# Patient Record
Sex: Female | Born: 1940 | Race: Black or African American | Hispanic: No | Marital: Married | Smoking: Never smoker
Health system: Southern US, Community
[De-identification: ages and names within clinical notes are randomized; demographics above are authoritative.]

## PROBLEM LIST (undated history)

## (undated) DIAGNOSIS — T7840XA Allergy, unspecified, initial encounter: Secondary | ICD-10-CM

## (undated) DIAGNOSIS — I10 Essential (primary) hypertension: Secondary | ICD-10-CM

## (undated) DIAGNOSIS — I4891 Unspecified atrial fibrillation: Secondary | ICD-10-CM

## (undated) DIAGNOSIS — J45909 Unspecified asthma, uncomplicated: Secondary | ICD-10-CM

## (undated) DIAGNOSIS — E119 Type 2 diabetes mellitus without complications: Secondary | ICD-10-CM

## (undated) DIAGNOSIS — I639 Cerebral infarction, unspecified: Secondary | ICD-10-CM

## (undated) DIAGNOSIS — E785 Hyperlipidemia, unspecified: Secondary | ICD-10-CM

## (undated) DIAGNOSIS — M1990 Unspecified osteoarthritis, unspecified site: Secondary | ICD-10-CM

## (undated) HISTORY — DX: Unspecified asthma, uncomplicated: J45.909

## (undated) HISTORY — PX: BREAST LUMPECTOMY: SHX2

## (undated) HISTORY — DX: Cerebral infarction, unspecified: I63.9

## (undated) HISTORY — DX: Allergy, unspecified, initial encounter: T78.40XA

## (undated) HISTORY — DX: Unspecified osteoarthritis, unspecified site: M19.90

## (undated) HISTORY — DX: Hyperlipidemia, unspecified: E78.5

---

## 2014-03-11 LAB — HM DEXA SCAN

## 2014-09-14 ENCOUNTER — Emergency Department: Payer: Self-pay | Admitting: Emergency Medicine

## 2016-10-29 ENCOUNTER — Other Ambulatory Visit: Payer: Self-pay | Admitting: Endocrinology

## 2016-10-29 DIAGNOSIS — Z1231 Encounter for screening mammogram for malignant neoplasm of breast: Secondary | ICD-10-CM

## 2016-11-29 ENCOUNTER — Ambulatory Visit: Payer: Self-pay

## 2019-11-29 ENCOUNTER — Observation Stay: Payer: Medicare HMO

## 2019-11-29 ENCOUNTER — Inpatient Hospital Stay
Admission: EM | Admit: 2019-11-29 | Discharge: 2019-12-03 | DRG: 065 | Disposition: A | Discharge status: Home Health | Payer: Medicare HMO | Attending: Internal Medicine | Admitting: Internal Medicine

## 2019-11-29 ENCOUNTER — Encounter: Payer: Self-pay | Admitting: Emergency Medicine

## 2019-11-29 ENCOUNTER — Emergency Department: Payer: Medicare HMO

## 2019-11-29 ENCOUNTER — Other Ambulatory Visit: Payer: Self-pay

## 2019-11-29 DIAGNOSIS — E785 Hyperlipidemia, unspecified: Secondary | ICD-10-CM | POA: Diagnosis not present

## 2019-11-29 DIAGNOSIS — H5500 Unspecified nystagmus: Secondary | ICD-10-CM | POA: Diagnosis present

## 2019-11-29 DIAGNOSIS — R918 Other nonspecific abnormal finding of lung field: Secondary | ICD-10-CM | POA: Diagnosis present

## 2019-11-29 DIAGNOSIS — I4891 Unspecified atrial fibrillation: Secondary | ICD-10-CM | POA: Diagnosis not present

## 2019-11-29 DIAGNOSIS — E1165 Type 2 diabetes mellitus with hyperglycemia: Secondary | ICD-10-CM

## 2019-11-29 DIAGNOSIS — Z794 Long term (current) use of insulin: Secondary | ICD-10-CM

## 2019-11-29 DIAGNOSIS — R1084 Generalized abdominal pain: Secondary | ICD-10-CM | POA: Diagnosis not present

## 2019-11-29 DIAGNOSIS — R109 Unspecified abdominal pain: Secondary | ICD-10-CM | POA: Diagnosis present

## 2019-11-29 DIAGNOSIS — I639 Cerebral infarction, unspecified: Secondary | ICD-10-CM | POA: Diagnosis not present

## 2019-11-29 DIAGNOSIS — G8194 Hemiplegia, unspecified affecting left nondominant side: Secondary | ICD-10-CM | POA: Diagnosis present

## 2019-11-29 DIAGNOSIS — I493 Ventricular premature depolarization: Secondary | ICD-10-CM | POA: Diagnosis present

## 2019-11-29 DIAGNOSIS — Z833 Family history of diabetes mellitus: Secondary | ICD-10-CM

## 2019-11-29 DIAGNOSIS — R4781 Slurred speech: Secondary | ICD-10-CM | POA: Diagnosis present

## 2019-11-29 DIAGNOSIS — Z8673 Personal history of transient ischemic attack (TIA), and cerebral infarction without residual deficits: Secondary | ICD-10-CM | POA: Diagnosis present

## 2019-11-29 DIAGNOSIS — I10 Essential (primary) hypertension: Secondary | ICD-10-CM | POA: Diagnosis present

## 2019-11-29 DIAGNOSIS — I63343 Cerebral infarction due to thrombosis of bilateral cerebellar arteries: Secondary | ICD-10-CM | POA: Diagnosis not present

## 2019-11-29 DIAGNOSIS — R112 Nausea with vomiting, unspecified: Secondary | ICD-10-CM | POA: Diagnosis present

## 2019-11-29 DIAGNOSIS — Z20822 Contact with and (suspected) exposure to covid-19: Secondary | ICD-10-CM | POA: Diagnosis present

## 2019-11-29 DIAGNOSIS — Z7984 Long term (current) use of oral hypoglycemic drugs: Secondary | ICD-10-CM

## 2019-11-29 DIAGNOSIS — Z8249 Family history of ischemic heart disease and other diseases of the circulatory system: Secondary | ICD-10-CM

## 2019-11-29 DIAGNOSIS — E1129 Type 2 diabetes mellitus with other diabetic kidney complication: Secondary | ICD-10-CM

## 2019-11-29 DIAGNOSIS — E119 Type 2 diabetes mellitus without complications: Secondary | ICD-10-CM

## 2019-11-29 DIAGNOSIS — I63349 Cerebral infarction due to thrombosis of unspecified cerebellar artery: Secondary | ICD-10-CM

## 2019-11-29 DIAGNOSIS — R29700 NIHSS score 0: Secondary | ICD-10-CM | POA: Diagnosis present

## 2019-11-29 HISTORY — DX: Unspecified atrial fibrillation: I48.91

## 2019-11-29 HISTORY — DX: Type 2 diabetes mellitus without complications: E11.9

## 2019-11-29 HISTORY — DX: Essential (primary) hypertension: I10

## 2019-11-29 LAB — CBC
HCT: 36.4 % (ref 36.0–46.0)
Hemoglobin: 11.9 g/dL — ABNORMAL LOW (ref 12.0–15.0)
MCH: 29.7 pg (ref 26.0–34.0)
MCHC: 32.7 g/dL (ref 30.0–36.0)
MCV: 90.8 fL (ref 80.0–100.0)
Platelets: 230 10*3/uL (ref 150–400)
RBC: 4.01 MIL/uL (ref 3.87–5.11)
RDW: 13.2 % (ref 11.5–15.5)
WBC: 9.8 10*3/uL (ref 4.0–10.5)
nRBC: 0 % (ref 0.0–0.2)

## 2019-11-29 LAB — BASIC METABOLIC PANEL
Anion gap: 14 (ref 5–15)
BUN: 16 mg/dL (ref 8–23)
CO2: 23 mmol/L (ref 22–32)
Calcium: 9 mg/dL (ref 8.9–10.3)
Chloride: 97 mmol/L — ABNORMAL LOW (ref 98–111)
Creatinine, Ser: 1.04 mg/dL — ABNORMAL HIGH (ref 0.44–1.00)
GFR calc Af Amer: 60 mL/min — ABNORMAL LOW (ref >60)
GFR calc non Af Amer: 51 mL/min — ABNORMAL LOW (ref >60)
Glucose, Bld: 274 mg/dL — ABNORMAL HIGH (ref 70–99)
Potassium: 3.8 mmol/L (ref 3.5–5.1)
Sodium: 134 mmol/L — ABNORMAL LOW (ref 135–145)

## 2019-11-29 LAB — LIPASE, BLOOD: Lipase: 34 U/L (ref 11–51)

## 2019-11-29 LAB — GLUCOSE, CAPILLARY
Glucose-Capillary: 154 mg/dL — ABNORMAL HIGH (ref 70–99)
Glucose-Capillary: 166 mg/dL — ABNORMAL HIGH (ref 70–99)
Glucose-Capillary: 203 mg/dL — ABNORMAL HIGH (ref 70–99)
Glucose-Capillary: 250 mg/dL — ABNORMAL HIGH (ref 70–99)

## 2019-11-29 LAB — SARS CORONAVIRUS 2 BY RT PCR (HOSPITAL ORDER, PERFORMED IN ~~LOC~~ HOSPITAL LAB): SARS Coronavirus 2: NEGATIVE

## 2019-11-29 MED ORDER — IOHEXOL 300 MG/ML  SOLN
125.0000 mL | Freq: Once | INTRAMUSCULAR | Status: AC | PRN
  Administered 2019-11-29: 125 mL via INTRAVENOUS

## 2019-11-29 MED ORDER — IOHEXOL 9 MG/ML PO SOLN
500.0000 mL | ORAL | Status: DC
  Administered 2019-11-29: 500 mL via ORAL

## 2019-11-29 MED ORDER — ENOXAPARIN SODIUM 40 MG/0.4ML ~~LOC~~ SOLN
40.0000 mg | SUBCUTANEOUS | Status: DC
  Administered 2019-11-29 – 2019-12-02 (×4): 40 mg via SUBCUTANEOUS
  Filled 2019-11-29 (×4): qty 0.4

## 2019-11-29 MED ORDER — INSULIN GLARGINE 100 UNIT/ML ~~LOC~~ SOLN
12.0000 [IU] | Freq: Every day | SUBCUTANEOUS | Status: DC
  Administered 2019-11-29 – 2019-12-03 (×5): 12 [IU] via SUBCUTANEOUS
  Filled 2019-11-29 (×5): qty 0.12

## 2019-11-29 MED ORDER — ASPIRIN EC 81 MG PO TBEC: 81.0000 mg | DELAYED_RELEASE_TABLET | Freq: Every day | ORAL | Status: DC

## 2019-11-29 MED ORDER — ASPIRIN EC 325 MG PO TBEC
325.0000 mg | DELAYED_RELEASE_TABLET | Freq: Every day | ORAL | Status: DC
  Administered 2019-11-30 – 2019-12-02 (×3): 325 mg via ORAL
  Filled 2019-11-29 (×3): qty 1

## 2019-11-29 MED ORDER — SENNOSIDES-DOCUSATE SODIUM 8.6-50 MG PO TABS: 1.0000 | ORAL_TABLET | Freq: Every evening | ORAL | Status: DC | PRN

## 2019-11-29 MED ORDER — ASPIRIN 81 MG PO CHEW
324.0000 mg | CHEWABLE_TABLET | Freq: Once | ORAL | Status: AC
  Administered 2019-11-29: 324 mg via ORAL
  Filled 2019-11-29: qty 4

## 2019-11-29 MED ORDER — ACETAMINOPHEN 325 MG PO TABS: 650.0000 mg | ORAL_TABLET | Freq: Four times a day (QID) | ORAL | Status: DC | PRN

## 2019-11-29 MED ORDER — STROKE: EARLY STAGES OF RECOVERY BOOK
Freq: Once | Status: AC
  Filled 2019-11-29: qty 1

## 2019-11-29 MED ORDER — MORPHINE SULFATE (PF) 2 MG/ML IV SOLN: 0.5000 mg | INTRAVENOUS | Status: DC | PRN

## 2019-11-29 MED ORDER — INSULIN ASPART 100 UNIT/ML ~~LOC~~ SOLN
0.0000 [IU] | SUBCUTANEOUS | Status: DC
  Administered 2019-11-29 (×2): 2 [IU] via SUBCUTANEOUS
  Administered 2019-11-29: 3 [IU] via SUBCUTANEOUS
  Administered 2019-11-30 (×2): 1 [IU] via SUBCUTANEOUS
  Administered 2019-11-30 (×2): 2 [IU] via SUBCUTANEOUS
  Administered 2019-12-01 (×2): 1 [IU] via SUBCUTANEOUS
  Administered 2019-12-01: 2 [IU] via SUBCUTANEOUS
  Administered 2019-12-01: 3 [IU] via SUBCUTANEOUS
  Administered 2019-12-02 (×2): 2 [IU] via SUBCUTANEOUS
  Administered 2019-12-02: 1 [IU] via SUBCUTANEOUS
  Administered 2019-12-02 – 2019-12-03 (×2): 2 [IU] via SUBCUTANEOUS
  Administered 2019-12-03: 1 [IU] via SUBCUTANEOUS
  Filled 2019-11-29 (×17): qty 1

## 2019-11-29 MED ORDER — HYDRALAZINE HCL 20 MG/ML IJ SOLN: 5.0000 mg | INTRAMUSCULAR | Status: DC | PRN

## 2019-11-29 MED ORDER — SIMVASTATIN 20 MG PO TABS
20.0000 mg | ORAL_TABLET | Freq: Every day | ORAL | Status: DC
  Administered 2019-11-29 – 2019-12-02 (×4): 20 mg via ORAL
  Filled 2019-11-29 (×4): qty 1

## 2019-11-29 MED ORDER — DILTIAZEM HCL 30 MG PO TABS
120.0000 mg | ORAL_TABLET | Freq: Every day | ORAL | Status: DC
  Administered 2019-11-29 – 2019-12-02 (×4): 120 mg via ORAL
  Filled 2019-11-29 (×4): qty 4

## 2019-11-29 MED ORDER — ONDANSETRON HCL 4 MG/2ML IJ SOLN: 4.0000 mg | Freq: Three times a day (TID) | INTRAMUSCULAR | Status: DC | PRN

## 2019-11-29 MED ORDER — SOTALOL HCL 80 MG PO TABS
80.0000 mg | ORAL_TABLET | Freq: Every day | ORAL | Status: DC
  Administered 2019-11-29 – 2019-12-02 (×4): 80 mg via ORAL
  Filled 2019-11-29 (×6): qty 1

## 2019-11-29 NOTE — ED Triage Notes (Signed)
Pt in via EMS from home with c/o nausea and vomiting since last pm. Pt was found sitting on floor and was dizzy. Pt did not fall. Hx of diabetes, a-fib, 165/80, FSBS 309, has not taken insulin today. 98% RA.

## 2019-11-29 NOTE — ED Notes (Signed)
Called CT and informed them that pt was done with oral contrast

## 2019-11-29 NOTE — H&P (Signed)
History and Physical    Tanya Aguirre PZW:[id number] DOB: 1940/11/11 DOA: 11/29/2019  Referring MD/NP/PA:   PCP: Alan Mulder, MD   Patient coming from:  The patient is coming from home.  At baseline, pt is independent for most of ADL.        Chief Complaint: Dizziness, nausea, vomiting and abdominal pain  HPI: Tanya Aguirre is a 79 y.o. female with medical history significant of hypertension, hyperlipidemia, diabetes mellitus, atrial fibrillation not on anticoagulants, who presents with dizziness, nausea vomiting and abdominal pain.  Per her daughter-in-law, patient started having dizziness and a difficulty walking at about 10 PM last night.  She also has some mild slurred speech, but no vision loss or hearing loss.  Patient does not have unilateral numbness or weakness in extremities.  Patient has headache.  She also reports nausea, vomiting and abdominal pain.  She has vomited several times with nonbloody nonbilious vomitus.  Her abdominal pain is located in the central abdomen, constant, aching, moderate, nonradiating.  No diarrhea.  No fever or chills.  No symptoms of UTI.  Patient does not have chest pain, shortness breath, cough.  ED Course: pt was found to have WBC 9.8, pending COVID-19 PCR, creatinine 1.04, BUN 16, lipase 34, temperature normal, blood pressure 155/71, heart rate 85, oxygen saturation 98% on room air. Ct-head showed possible right cerebellar infarct. Pt is placed on progressive bed for observation.  Neurology, Dr. Loretha Brasil is consulted.  Review of Systems:   General: no fevers, chills, no body weight gain, has fatigue HEENT: no blurry vision, hearing changes or sore throat Respiratory: no dyspnea, coughing, wheezing CV: no chest pain, no palpitations GI: has nausea, vomiting, abdominal pain, no diarrhea, constipation GU: no dysuria, burning on urination, increased urinary frequency, hematuria  Ext: no leg edema Neuro: no unilateral weakness, numbness,  or tingling, no vision change or hearing loss. Has dizziness and slurred speech Skin: no rash, no skin tear. MSK: No muscle spasm, no deformity, no limitation of range of movement in spin Heme: No easy bruising.  Travel history: No recent long distant travel.  Allergy: No Known Allergies  Past Medical History:  Diagnosis Date  . A-fib (HCC)   . Diabetes mellitus without complication (HCC)   . Hypertension     Past Surgical History:  Procedure Laterality Date  . BREAST LUMPECTOMY Right     Social History:  reports that she has never smoked. She has never used smokeless tobacco. She reports that she does not drink alcohol or use drugs.  Family History:  Family History  Problem Relation Age of Onset  . Hypertension Father   . Hypertension Sister   . Diabetes Mellitus II Brother      Prior to Admission medications   Medication Sig Start Date End Date Taking? Authorizing Provider  amLODipine (NORVASC) 10 MG tablet Take 10 mg by mouth daily at 6 (six) AM. 10/25/19   [provider]  diltiazem (CARDIZEM) 120 MG tablet Take 120 mg by mouth at bedtime. 10/26/19   [provider]  furosemide (LASIX) 40 MG tablet Take 40 mg by mouth 2 (two) times daily. 10/25/19   [provider]  metFORMIN (GLUCOPHAGE) 1000 MG tablet Take 1,000 mg by mouth 2 (two) times daily. 10/25/19   [provider]  potassium chloride SA (KLOR-CON) 20 MEQ tablet Take 20 mEq by mouth daily. 10/25/19   [provider]  simvastatin (ZOCOR) 20 MG tablet Take 20 mg by mouth at bedtime.  10/26/19   [provider]  sotalol (BETAPACE) 80 MG tablet Take 80 mg by mouth at bedtime. 10/25/19   [provider]  Vitamin D, Ergocalciferol, (DRISDOL) 1.25 MG (50000 UNIT) CAPS capsule Take 50,000 Units by mouth once a week. 10/25/19   [provider]    Physical Exam: Vitals:   11/29/19 1200 11/29/19 1230 11/29/19 1300 11/29/19 1330  BP: (!) 152/84 (!) 158/70  136/74 128/63  Pulse:  (!) 103 (!) 103 (!) 101  Resp: 13 12    Temp:      TempSrc:      SpO2:  98% 96% 96%  Weight:      Height:       General: Not in acute distress HEENT:       Eyes: PERRL, EOMI, no scleral icterus.       ENT: No discharge from the ears and nose, no pharynx injection, no tonsillar enlargement.        Neck: No JVD, no bruit, no mass felt. Heme: No neck lymph node enlargement. Cardiac: S1/S2, RRR, No murmurs, No gallops or rubs. Respiratory:  No rales, wheezing, rhonchi or rubs. GI: Soft, nondistended, has tenderness in central abdomen, no rebound pain, no organomegaly, BS present. GU: No hematuria Ext: No pitting leg edema bilaterally. 2+DP/PT pulse bilaterally. Musculoskeletal: No joint deformities, No joint redness or warmth, no limitation of ROM in spin. Skin: No rashes.  Neuro: Alert, oriented X3, cranial nerves II-XII grossly intact.  Muscle strength 5/5 in all extremities, sensation to light touch intact. Brachial reflex 2+ bilaterally. Psych: Patient is not psychotic, no suicidal or hemocidal ideation.  Labs on Admission: I have personally reviewed following labs and imaging studies  CBC: Recent Labs  Lab 11/29/19 0923  WBC 9.8  HGB 11.9*  HCT 36.4  MCV 90.8  PLT 230   Basic Metabolic Panel: Recent Labs  Lab 11/29/19 0923  NA 134*  K 3.8  CL 97*  CO2 23  GLUCOSE 274*  BUN 16  CREATININE 1.04*  CALCIUM 9.0   GFR: Estimated Creatinine Clearance: 55.7 mL/min (A) (by C-G formula based on SCr of 1.04 mg/dL (H)). Liver Function Tests: No results for input(s): AST, ALT, ALKPHOS, BILITOT, PROT, ALBUMIN in the last 168 hours. Recent Labs  Lab 11/29/19 0923  LIPASE 34   No results for input(s): AMMONIA in the last 168 hours. Coagulation Profile: No results for input(s): INR, PROTIME in the last 168 hours. Cardiac Enzymes: No results for input(s): CKTOTAL, CKMB, CKMBINDEX, TROPONINI in the last 168 hours. BNP (last 3 results) No results  for input(s): PROBNP in the last 8760 hours. HbA1C: No results for input(s): HGBA1C in the last 72 hours. CBG: Recent Labs  Lab 11/29/19 0922 11/29/19 1519  GLUCAP 250* 203*   Lipid Profile: No results for input(s): CHOL, HDL, LDLCALC, TRIG, CHOLHDL, LDLDIRECT in the last 72 hours. Thyroid Function Tests: No results for input(s): TSH, T4TOTAL, FREET4, T3FREE, THYROIDAB in the last 72 hours. Anemia Panel: No results for input(s): VITAMINB12, FOLATE, FERRITIN, TIBC, IRON, RETICCTPCT in the last 72 hours. Urine analysis: No results found for: COLORURINE, APPEARANCEUR, LABSPEC, PHURINE, GLUCOSEU, HGBUR, BILIRUBINUR, KETONESUR, PROTEINUR, UROBILINOGEN, NITRITE, LEUKOCYTESUR Sepsis Labs: @LABRCNTIP (procalcitonin:4,lacticidven:4) ) Recent Results (from the past 240 hour(s))  SARS Coronavirus 2 by RT PCR (hospital order, performed in [facility] hospital lab) Nasopharyngeal Nasopharyngeal Swab     Status: None   Collection Time: 11/29/19 12:07 PM   Specimen: Nasopharyngeal Swab  Result Value Ref Range Status  SARS Coronavirus 2 NEGATIVE NEGATIVE Final    Comment: (NOTE) SARS-CoV-2 target nucleic acids are NOT DETECTED. The SARS-CoV-2 RNA is generally detectable in upper and lower respiratory specimens during the acute phase of infection. The lowest concentration of SARS-CoV-2 viral copies this assay can detect is 250 copies / mL. A negative result does not preclude SARS-CoV-2 infection and should not be used as the sole basis for treatment or other patient management decisions.  A negative result may occur with improper specimen collection / handling, submission of specimen other than nasopharyngeal swab, presence of viral mutation(s) within the areas targeted by this assay, and inadequate number of viral copies (<250 copies / mL). A negative result must be combined with clinical observations, patient history, and epidemiological information. Fact Sheet for Patients:    BoilerBrush.com.cyhttps://www.fda.gov/media/136312/download Fact Sheet for Healthcare Providers: https://pope.com/https://www.fda.gov/media/136313/download This test is not yet approved or cleared  by the [place] FDA and has been authorized for detection and/or diagnosis of SARS-CoV-2 by FDA under an Emergency Use Authorization (EUA).  This EUA will remain in effect (meaning this test can be used) for the duration of the COVID-19 declaration under Section 564(b)(1) of the Act, 21 U.S.C. section 360bbb-3(b)(1), unless the authorization is terminated or revoked sooner. Performed at [facility], [address] [place], [place] [address]      Radiological Exams on Admission: CT HEAD WO CONTRAST  Result Date: 11/29/2019 CLINICAL DATA:  Recent fall with facial pain, initial encounter EXAM: CT HEAD WITHOUT CONTRAST TECHNIQUE: Contiguous axial images were obtained from the base of the skull through the vertex without intravenous contrast. COMPARISON:  None. FINDINGS: Brain: There is a geographic area of decreased attenuation identified within the right cerebellar hemisphere consistent with acute to subacute ischemia. Mild atrophic changes are noted commenced with the patient's given age. No findings to suggest acute hemorrhage or space-occupying mass lesion are noted. Vascular: No hyperdense vessel or unexpected calcification. Skull: Normal. Negative for fracture or focal lesion. Sinuses/Orbits: No acute finding. Other: None. IMPRESSION: Geographic area of decreased attenuation within the right cerebellar hemisphere superiorly consistent with acute to subacute ischemia. MRI may be helpful further evaluation. Electronically Signed   By: Alcide CleverMark  Lukens M.D.   On: 11/29/2019 09:34   MR BRAIN WO CONTRAST  Result Date: 11/29/2019 CLINICAL DATA:  Abnormal CT EXAM: MRI HEAD WITHOUT CONTRAST TECHNIQUE: Multiplanar, multiecho pulse sequences of the brain and surrounding structures were obtained without intravenous contrast.  COMPARISON:  Correlation made with CT earlier same day FINDINGS: Motion artifact is present. Brain: Moderate size acute infarction of the right cerebellar hemisphere. Subcentimeter acute infarct of the left cerebellum. There is no significant mass effect or hydrocephalus. Chronic inferior right frontal infarct. Punctate focus of susceptibility adjacent to the posterior body of the right lateral ventricle compatible with chronic microhemorrhage or less likely mineralization. Patchy T2 hyperintensity in the supratentorial white matter is nonspecific but may reflect mild chronic microvascular ischemic changes. Prominence of the ventricles and sulci reflects minor generalized parenchymal volume loss. There is no intracranial mass, mass effect, or edema. There is no hydrocephalus or extra-axial fluid collection. No abnormal enhancement. Vascular: Major vessel flow voids at the skull base are preserved. Skull and upper cervical spine: Normal marrow signal is preserved. Sinuses/Orbits: Minor mucosal thickening.  Orbits are unremarkable. Other: Sella is unremarkable.  Mastoid air cells are clear. 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 Subcentimeter acute infarct of the left cerebellum. No significant mass effect or hydrocephalus. Chronic inferior right  frontal infarct. Mild chronic microvascular ischemic changes. Electronically Signed   By: Macy Mis M.D.   On: 11/29/2019 15:20   US Carotid Bilateral (at [facility] and [facility] only)  Result Date: 11/29/2019 CLINICAL DATA:  79 year old female with a history of stroke symptoms EXAM: BILATERAL CAROTID DUPLEX ULTRASOUND TECHNIQUE: Pearline Cables scale imaging, color Doppler and duplex ultrasound were performed of bilateral carotid and vertebral arteries in the neck. COMPARISON:  None. FINDINGS: Criteria: Quantification of carotid stenosis is based on velocity parameters that correlate the residual internal carotid diameter with NASCET-based stenosis levels,  using the diameter of the distal internal carotid lumen as the denominator for stenosis measurement. The following velocity measurements were obtained: RIGHT ICA:  Systolic 867 cm/sec, Diastolic 30 cm/sec CCA:  92 cm/sec SYSTOLIC ICA/CCA RATIO:  1.1 ECA:  83 cm/sec LEFT ICA:  Systolic 70 cm/sec, Diastolic 12 cm/sec CCA:  56 cm/sec SYSTOLIC ICA/CCA RATIO:  1.3 ECA:  114 cm/sec Right Brachial SBP: Not acquired Left Brachial SBP: Not acquired RIGHT CAROTID ARTERY: No significant calcifications of the right common carotid artery. Intermediate waveform maintained. Heterogeneous and partially calcified plaque at the right carotid bifurcation. No significant lumen shadowing. Low resistance waveform of the right ICA. No significant tortuosity. RIGHT VERTEBRAL ARTERY: Antegrade flow with low resistance waveform. LEFT CAROTID ARTERY: No significant calcifications of the left common carotid artery. Intermediate waveform maintained. Heterogeneous and partially calcified plaque at the left carotid bifurcation without significant lumen shadowing. Low resistance waveform of the left ICA. No significant tortuosity. LEFT VERTEBRAL ARTERY:  Antegrade flow with low resistance waveform. IMPRESSION: Color duplex indicates minimal heterogeneous and calcified plaque, with no hemodynamically significant stenosis by duplex criteria in the extracranial cerebrovascular circulation. Signed, Dulcy Fanny. Dellia Nims, RPVI [facility] Radiology Electronically Signed   By: Corrie Mckusick D.O.   On: 11/29/2019 14:27     EKG: Independently reviewed.  Sinus rhythm, which is for atrial fibrillation voltage, QTC 459, mild T wave inversion in inferior leads and V4-V6, early R wave progression, Q-wave in lead III  Assessment/Plan Principal Problem:   Stroke [facility]) Active Problems:   A-fib (HCC)   Diabetes mellitus without complication (HCC)   Hypertension   Abdominal pain   HLD  (hyperlipidemia)   Stroke [facility]): Ct-head showed possible right cerebellar infarct.  Neurology, Dr. Irish Elders is consulted  - Placed on progressive unit for obs - Obtain MRI --> showed moderate size acute infarction of the right cerebellum. Subcentimeter acute infarct of the left cerebellum. No significant mass effect or hydrocephalus. - Check carotid dopplers  - ASA and zocor - fasting lipid panel and HbA1c  - 2D transthoracic echocardiography  - swallowing screen. If fails, will get SLP - PT/OT consult  A-fib [facility]): pt is not on anticoagulants at home. -on ASA -Continue sotalol and Cardizem -Patient may need chronic anticoagulant -->f/u neurology's recommendation  Diabetes mellitus without complication ([place]): Most recent A1c not on record. Patient is taking Metformin and glargine insulin 12-20 unit daily at home -will give glargine insulin 12 unit daily -SSI  Hypertension: -Hold Lasix and amlodipine to allow permissive hypertension -As needed hydralazine for SBP>220 or dBP > 120 -pt is on Cardizem and sotalol which is a full atrial fibrillation  Abdominal pain: Patient has nausea vomiting and central abdominal pain.  Lipase 34.  Etiology is not clear. -Follow-up CT abdomen/pelvis -As needed Zofran and morphine  HLD (hyperlipidemia): -zocor       DVT ppx:     SQ Lovenox  Code Status: Full code Family Communication:   Yes, patient's daughter-in-law    at bed side Disposition Plan:  Anticipate discharge back to previous home environment Consults called: Dr. Loretha Brasil of neurology Admission status:  progressive unit for obs     Status is: Observation  The patient remains OBS appropriate and will d/c before 2 midnights.  Dispo: The patient is from: Home              Anticipated d/c is to: Home              Anticipated d/c date is: 1 day              Patient currently is not medically stable to d/c.            Date of Service 11/29/2019    Lorretta Harp [facility]   If 7PM-7AM, please contact night-coverage [email or web address] 11/29/2019, 6:32 PM

## 2019-11-29 NOTE — Consult Note (Signed)
Reason for Consult: off balance Requesting Physician: Dr. Ellender Hose  CC: off balance.   HPI: Tanya Aguirre is an 79 y.o. femalewith past medical history of hypertension, diabetes, A. fib, here with dizziness and difficulty walking.  The patient states her symptoms started around 10:00- 10:30 last nigh that are associated with N/V.  She states that she was at rest, when she began to feel very dizzy and weak. She states occasionally she would take ASA but nor regularly.  She does not take aspirin.  History of A. Fib that is known to her and primary physician.  She is on B blocker for rate control but no anticoagulation.    Past Medical History:  Diagnosis Date  . A-fib ([place])   . Diabetes mellitus without complication ([place])   . Hypertension     History reviewed. No pertinent surgical history.  No family history on file.  Social History:  reports that she has never smoked. She has never used smokeless tobacco. She reports that she does not drink alcohol or use drugs.  No Known Allergies  Medications: I have reviewed the patient's current medications.  ROS: History obtained from the patient  General ROS: negative for - chills, fatigue, fever, night sweats, weight gain or weight loss Psychological ROS: negative for - behavioral disorder, hallucinations, memory difficulties, mood swings or suicidal ideation Ophthalmic ROS: negative for - blurry vision, double vision, eye pain or loss of vision ENT ROS: negative for - epistaxis, nasal discharge, oral lesions, sore throat, tinnitus or vertigo Allergy and Immunology ROS: negative for - hives or itchy/watery eyes Hematological and Lymphatic ROS: negative for - bleeding problems, bruising or swollen lymph nodes Endocrine ROS: negative for - galactorrhea, hair pattern changes, polydipsia/polyuria or temperature intolerance Respiratory ROS: negative for - cough, hemoptysis, shortness of breath or wheezing Cardiovascular ROS: negative for -  chest pain, dyspnea on exertion, edema or irregular heartbeat Gastrointestinal ROS: negative for - abdominal pain, diarrhea, hematemesis, nausea/vomiting or stool incontinence Genito-Urinary ROS: negative for - dysuria, hematuria, incontinence or urinary frequency/urgency Musculoskeletal ROS: negative for - joint swelling or muscular weakness Neurological ROS: as noted in HPI Dermatological ROS: negative for rash and skin lesion changes  Physical Examination: Blood pressure (!) 155/71, pulse 85, temperature 97.8 F (36.6 C), temperature source Oral, resp. rate 11, height 5\' 6"  (1.676 m), weight 108.9 kg, SpO2 98 %.   Neurological Examination   Mental Status: Alert, oriented, thought content appropriate.  Speech fluent without evidence of aphasia.  Able to follow 3 step commands without difficulty. Cranial Nerves: II: Discs flat bilaterally; Visual fields grossly normal, pupils equal, round, reactive to light and accommodation III,IV, VI: ptosis not present, extra-ocular motions intact bilaterally V,VII: smile symmetric, facial light touch sensation normal bilaterally VIII: hearing normal bilaterally IX,X: gag reflex present XI: bilateral shoulder shrug XII: midline tongue extension Motor: Right : Upper extremity   5/5    Left:     Upper extremity   5/5  Lower extremity   5/5     Lower extremity   5/5 Tone and bulk:normal tone throughout; no atrophy noted Sensory: Pinprick and light touch intact throughout, bilaterally Deep Tendon Reflexes: 2+ and symmetric throughout Plantars: Right: downgoing   Left: downgoing Cerebellar: normal finger-to-nose Gait: not tested      Laboratory Studies:   Basic Metabolic Panel: Recent Labs  Lab 11/29/19 0923  NA 134*  K 3.8  CL 97*  CO2 23  GLUCOSE 274*  BUN 16  CREATININE 1.04*  CALCIUM 9.0    Liver Function Tests: No results for input(s): AST, ALT, ALKPHOS, BILITOT, PROT, ALBUMIN in the last 168 hours. No results for input(s):  LIPASE, AMYLASE in the last 168 hours. No results for input(s): AMMONIA in the last 168 hours.  CBC: Recent Labs  Lab 11/29/19 0923  WBC 9.8  HGB 11.9*  HCT 36.4  MCV 90.8  PLT 230    Cardiac Enzymes: No results for input(s): CKTOTAL, CKMB, CKMBINDEX, TROPONINI in the last 168 hours.  BNP: Invalid input(s): POCBNP  CBG: Recent Labs  Lab 11/29/19 0922  GLUCAP 250*    Microbiology: No results found for this or any previous visit.  Coagulation Studies: No results for input(s): LABPROT, INR in the last 72 hours.  Urinalysis: No results for input(s): COLORURINE, LABSPEC, PHURINE, GLUCOSEU, HGBUR, BILIRUBINUR, KETONESUR, PROTEINUR, UROBILINOGEN, NITRITE, LEUKOCYTESUR in the last 168 hours.  Invalid input(s): APPERANCEUR  Lipid Panel:  No results found for: CHOL, TRIG, HDL, CHOLHDL, VLDL, LDLCALC  HgbA1C: No results found for: HGBA1C  Urine Drug Screen:  No results found for: LABOPIA, COCAINSCRNUR, LABBENZ, AMPHETMU, THCU, LABBARB  Alcohol Level: No results for input(s): ETH in the last 168 hours.  Other results: EKG: A-fib rate of 101.  Imaging: CT HEAD WO CONTRAST  Result Date: 11/29/2019 CLINICAL DATA:  Recent fall with facial pain, initial encounter EXAM: CT HEAD WITHOUT CONTRAST TECHNIQUE: Contiguous axial images were obtained from the base of the skull through the vertex without intravenous contrast. COMPARISON:  None. FINDINGS: Brain: There is a geographic area of decreased attenuation identified within the right cerebellar hemisphere consistent with acute to subacute ischemia. Mild atrophic changes are noted commenced with the patient's given age. No findings to suggest acute hemorrhage or space-occupying mass lesion are noted. Vascular: No hyperdense vessel or unexpected calcification. Skull: Normal. Negative for fracture or focal lesion. Sinuses/Orbits: No acute finding. Other: None. IMPRESSION: Geographic area of decreased attenuation within the right cerebellar  hemisphere superiorly consistent with acute to subacute ischemia. MRI may be helpful further evaluation. Electronically Signed   By: Alcide Clever M.D.   On: 11/29/2019 09:34     Assessment/Plan: 79 y.o. femalewith past medical history of hypertension, diabetes, A. fib, here with dizziness and difficulty walking.  The patient states her symptoms started around 10:00- 10:30 last nigh that are associated with N/V.  She states that she was at rest, when she began to feel very dizzy and weak. She states occasionally she would take ASA but nor regularly.  She does not take aspirin.  History of A. Fib that is known to her and primary physician.  She is on B blocker for rate control but no anticoagulation.    - R cerebellar infarct on [facility] - admission  - MRI of brain - ASA daily of 325 - Will need anticoagulation in near future depending on the size of the infarct  - pt/ot  11/29/2019, 11:58 AM

## 2019-11-29 NOTE — ED Triage Notes (Signed)
Patient to ED via [facility]. Reports generalized weakness and nausea since last night. Denies fever. Patient states she fell this morning after standing and becoming dizzy. Reports she hit the side of her face when she fell.

## 2019-11-29 NOTE — ED Provider Notes (Signed)
[facility] Emergency Department Provider Note  ____________________________________________   First MD Initiated Contact with Patient 11/29/19 1034     (approximate)  I have reviewed the triage vital signs and the nursing notes.   HISTORY  Chief Complaint Weakness    HPI Tanya Aguirre is a 79 y.o. female with past medical history of hypertension, diabetes, A. fib, here with dizziness and difficulty walking.  The patient states her symptoms started around 10:00 last night.  She states that she was at rest, when she began to feel very dizzy and weak.  She has been essentially unable to walk since then.  She feels like the room is spinning.  She is been weak bilaterally, and states that she loses her balance and falls every time she tries to get up or walk.  Symptoms are all new.  Denies any numbness.  Denies any difficulty speaking or swallowing.  Denies known history of coronary or cerebrovascular disease.  She does not take aspirin.  History of A. fib.  No specific alleviating or aggravating factors.       Past Medical History:  Diagnosis Date  . A-fib (HCC)   . Diabetes mellitus without complication (HCC)   . Hypertension     Patient Active Problem List   Diagnosis Date Noted  . A-fib (HCC)   . Diabetes mellitus without complication (HCC)   . Hypertension   . Stroke (HCC)   . Abdominal pain   . HLD (hyperlipidemia)     History reviewed. No pertinent surgical history.  Prior to Admission medications   Medication Sig Start Date End Date Taking? Authorizing Provider  amLODipine (NORVASC) 10 MG tablet Take 10 mg by mouth daily.  10/25/19  Yes [provider]  aspirin EC 81 MG tablet Take 81 mg by mouth daily.   Yes [provider]  diltiazem (CARDIZEM) 120 MG tablet Take 120 mg by mouth at bedtime. 10/26/19  Yes [provider]  furosemide (LASIX) 40 MG tablet Take 40 mg by mouth 2 (two) times daily. 10/25/19  Yes  [provider]  insulin glargine, 2 Unit Dial, (TOUJEO MAX SOLOSTAR) 300 UNIT/ML Solostar Pen Inject 12-20 Units into the skin daily.   Yes [provider]  metFORMIN (GLUCOPHAGE) 1000 MG tablet Take 1,000 mg by mouth 2 (two) times daily. 10/25/19  Yes [provider]  potassium chloride SA (KLOR-CON) 20 MEQ tablet Take 20 mEq by mouth daily. 10/25/19  Yes [provider]  simvastatin (ZOCOR) 20 MG tablet Take 20 mg by mouth at bedtime. 10/26/19  Yes [provider]  sotalol (BETAPACE) 80 MG tablet Take 80 mg by mouth at bedtime. 10/25/19  Yes [provider]  Vitamin D, Ergocalciferol, (DRISDOL) 1.25 MG (50000 UNIT) CAPS capsule Take 50,000 Units by mouth once a week. 10/25/19  Yes [provider]    Allergies Patient has no known allergies.  No family history on file.  Social History Social History   Tobacco Use  . Smoking status: Never Smoker  . Smokeless tobacco: Never Used  Substance Use Topics  . Alcohol use: Never  . Drug use: Never    Review of Systems  Review of Systems  Constitutional: Positive for fatigue. Negative for fever.  HENT: Negative for congestion and sore throat.   Eyes: Negative for visual disturbance.  Respiratory: Negative for cough and shortness of breath.   Cardiovascular: Negative for chest pain.  Gastrointestinal: Negative for abdominal pain, diarrhea, nausea and vomiting.  Genitourinary: Negative for flank pain.  Musculoskeletal: Positive for gait problem. Negative for back pain and neck pain.  Skin: Negative for rash and wound.  Neurological: Positive for dizziness. Negative for weakness.  All other systems reviewed and are negative.    ____________________________________________  PHYSICAL EXAM:      VITAL SIGNS: ED Triage Vitals  Enc Vitals Group     BP 11/29/19 0907 (!) 153/60     Pulse Rate 11/29/19 0907 85     Resp 11/29/19 0907 16     Temp 11/29/19 0907 97.8 F (36.6 C)      Temp Source 11/29/19 0907 Oral     SpO2 11/29/19 0907 98 %     Weight 11/29/19 0908 240 lb (108.9 kg)     Height 11/29/19 0908 5\' 6"  (1.676 m)     Head Circumference --      Peak Flow --      Pain Score 11/29/19 0913 0     Pain Loc --      Pain Edu? --      Excl. in GC? --      Physical Exam Vitals and nursing note reviewed.  Constitutional:      General: She is not in acute distress.    Appearance: She is well-developed.  HENT:     Head: Normocephalic and atraumatic.  Eyes:     Conjunctiva/sclera: Conjunctivae normal.  Cardiovascular:     Rate and Rhythm: Normal rate and regular rhythm.     Heart sounds: Normal heart sounds. No murmur. No friction rub.  Pulmonary:     Effort: Pulmonary effort is normal. No respiratory distress.     Breath sounds: Normal breath sounds. No wheezing or rales.  Abdominal:     General: There is no distension.     Palpations: Abdomen is soft.     Tenderness: There is no abdominal tenderness.  Musculoskeletal:     Cervical back: Neck supple.  Skin:    General: Skin is warm.     Capillary Refill: Capillary refill takes less than 2 seconds.  Neurological:     Mental Status: She is alert and oriented to person, place, and time.     Motor: No abnormal muscle tone.     Comments: CNII-XII grossly intact. Horizontal nystagmus noted with eye movements. Past-pointing noted on bilateral UE FTN testing. Abnormal HTS bilaterally. Strength 5/5 in UE and LE. Endorses normal sensation to light touch, though questionable diminished extinction on left.       ____________________________________________   LABS (all labs ordered are listed, but only abnormal results are displayed)  Labs Reviewed  BASIC METABOLIC PANEL - Abnormal; Notable for the following components:      Result Value   Sodium 134 (*)    Chloride 97 (*)    Glucose, Bld 274 (*)    Creatinine, Ser 1.04 (*)    GFR calc non Af Amer 51 (*)    GFR calc Af Amer 60 (*)    All other  components within normal limits  CBC - Abnormal; Notable for the following components:   Hemoglobin 11.9 (*)    All other components within normal limits  GLUCOSE, CAPILLARY - Abnormal; Notable for the following components:   Glucose-Capillary 250 (*)    All other components within normal limits  GLUCOSE, CAPILLARY - Abnormal; Notable for the following components:   Glucose-Capillary 203 (*)    All other components within normal limits  SARS CORONAVIRUS 2 BY RT PCR (  HOSPITAL ORDER, PERFORMED IN [facility])  LIPASE, BLOOD  URINALYSIS, COMPLETE (UACMP) WITH MICROSCOPIC  HEMOGLOBIN A1C  LIPID PANEL  CBG MONITORING, ED    ____________________________________________  EKG: Atrial fibrillation, VR 96. QRS 84, QTc 459. TWI in inferolateral leads, Q waves noted in lead III. No ST elevations. ________________________________________  RADIOLOGY All imaging, including plain films, CT scans, and ultrasounds, independently reviewed by me, and interpretations confirmed via formal radiology reads.  ED MD interpretation:   CT Head: Findings suspicious of acute to subacute ischemia in right cerebellum  Official radiology report(s): CT HEAD WO CONTRAST  Result Date: 11/29/2019 CLINICAL DATA:  Recent fall with facial pain, initial encounter EXAM: CT HEAD WITHOUT CONTRAST TECHNIQUE: Contiguous axial images were obtained from the base of the skull through the vertex without intravenous contrast. COMPARISON:  None. FINDINGS: Brain: There is a geographic area of decreased attenuation identified within the right cerebellar hemisphere consistent with acute to subacute ischemia. Mild atrophic changes are noted commenced with the patient's given age. No findings to suggest acute hemorrhage or space-occupying mass lesion are noted. Vascular: No hyperdense vessel or unexpected calcification. Skull: Normal. Negative for fracture or focal lesion. Sinuses/Orbits: No acute finding. Other: None.  IMPRESSION: Geographic area of decreased attenuation within the right cerebellar hemisphere superiorly consistent with acute to subacute ischemia. MRI may be helpful further evaluation. Electronically Signed   By: Alcide Clever M.D.   On: 11/29/2019 09:34   MR BRAIN WO CONTRAST  Result Date: 11/29/2019 CLINICAL DATA:  Abnormal CT EXAM: MRI HEAD WITHOUT CONTRAST TECHNIQUE: Multiplanar, multiecho pulse sequences of the brain and surrounding structures were obtained without intravenous contrast. COMPARISON:  Correlation made with CT earlier same day FINDINGS: Motion artifact is present. Brain: Moderate size acute infarction of the right cerebellar hemisphere. Subcentimeter acute infarct of the left cerebellum. There is no significant mass effect or hydrocephalus. Chronic inferior right frontal infarct. Punctate focus of susceptibility adjacent to the posterior body of the right lateral ventricle compatible with chronic microhemorrhage or less likely mineralization. Patchy T2 hyperintensity in the supratentorial white matter is nonspecific but may reflect mild chronic microvascular ischemic changes. Prominence of the ventricles and sulci reflects minor generalized parenchymal volume loss. There is no intracranial mass, mass effect, or edema. There is no hydrocephalus or extra-axial fluid collection. No abnormal enhancement. Vascular: Major vessel flow voids at the skull base are preserved. Skull and upper cervical spine: Normal marrow signal is preserved. Sinuses/Orbits: Minor mucosal thickening.  Orbits are unremarkable. Other: Sella is unremarkable.  Mastoid air cells are clear. 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 Subcentimeter acute infarct of the left cerebellum. No significant mass effect or hydrocephalus. Chronic inferior right frontal infarct. Mild chronic microvascular ischemic changes. Electronically Signed   By: Guadlupe Spanish M.D.   On: 11/29/2019 15:20   US Carotid  Bilateral (at [facility] and [facility] only)  Result Date: 11/29/2019 CLINICAL DATA:  79 year old female with a history of stroke symptoms EXAM: BILATERAL CAROTID DUPLEX ULTRASOUND TECHNIQUE: Wallace Cullens scale imaging, color Doppler and duplex ultrasound were performed of bilateral carotid and vertebral arteries in the neck. COMPARISON:  None. FINDINGS: Criteria: Quantification of carotid stenosis is based on velocity parameters that correlate the residual internal carotid diameter with NASCET-based stenosis levels, using the diameter of the distal internal carotid lumen as the denominator for stenosis measurement. The following velocity measurements were obtained: RIGHT ICA:  Systolic 102 cm/sec, Diastolic 30 cm/sec CCA:  92 cm/sec SYSTOLIC ICA/CCA RATIO:  1.1  ECA:  83 cm/sec LEFT ICA:  Systolic 70 cm/sec, Diastolic 12 cm/sec CCA:  56 cm/sec SYSTOLIC ICA/CCA RATIO:  1.3 ECA:  114 cm/sec Right Brachial SBP: Not acquired Left Brachial SBP: Not acquired RIGHT CAROTID ARTERY: No significant calcifications of the right common carotid artery. Intermediate waveform maintained. Heterogeneous and partially calcified plaque at the right carotid bifurcation. No significant lumen shadowing. Low resistance waveform of the right ICA. No significant tortuosity. RIGHT VERTEBRAL ARTERY: Antegrade flow with low resistance waveform. LEFT CAROTID ARTERY: No significant calcifications of the left common carotid artery. Intermediate waveform maintained. Heterogeneous and partially calcified plaque at the left carotid bifurcation without significant lumen shadowing. Low resistance waveform of the left ICA. No significant tortuosity. LEFT VERTEBRAL ARTERY:  Antegrade flow with low resistance waveform. IMPRESSION: Color duplex indicates minimal heterogeneous and calcified plaque, with no hemodynamically significant stenosis by duplex criteria in the extracranial cerebrovascular circulation. Signed, Yvone NeuJaime S. Reyne DumasWagner, DO, RPVI [facility] Electronically Signed   By: Gilmer MorJaime  Wagner D.O.   On: 11/29/2019 14:27    ____________________________________________  PROCEDURES   Procedure(s) performed (including Critical Care):  .1-3 Lead EKG Interpretation Performed by: Shaune PollackIsaacs, Diem Pagnotta, MD Authorized by: Shaune PollackIsaacs, Cache Decoursey, MD     Interpretation: abnormal     ECG rate:  80-110   ECG rate assessment: normal     Rhythm: atrial fibrillation     Ectopy: PVCs     Conduction: normal   Comments:     Indication: stroke    ____________________________________________  INITIAL IMPRESSION / MDM / ASSESSMENT AND PLAN / ED COURSE  As part of my medical decision making, I reviewed the following data within the electronic MEDICAL RECORD NUMBER Nursing notes reviewed and incorporated, Old chart reviewed, Notes from prior ED visits, and [place] Controlled Substance Database       *Hassie BruceCarrie Ann Benison was evaluated in Emergency Department on 11/29/2019 for the symptoms described in the history of present illness. She was evaluated in the context of the global COVID-19 pandemic, which necessitated consideration that the patient might be at risk for infection with the SARS-CoV-2 virus that causes COVID-19. Institutional protocols and algorithms that pertain to the evaluation of patients at risk for COVID-19 are in a state of rapid change based on information released by regulatory bodies including the CDC and federal and state organizations. These policies and algorithms were followed during the patient's care in the ED.  Some ED evaluations and interventions may be delayed as a result of limited staffing during the pandemic.*     Medical Decision Making:  79 yo F here with new ataxia and nystagmus, LKN at 10 PM last night. Suspect acute cerebellar CVA, likely 2/2 AFib. Pt not on anticoagulation per report. CT head reviewed by myself, c/w likely subacute to acute ischemic stroke. Discussed with Dr. Loretha BrasilZeylikman. Will  give ASA, admit to medicine. Outside of any tPA or LVO window. Family updated. Old records, EKG reviewed.   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, unspecified blood vessel laterality (HCC)  Stroke (HCC)     MEDICATIONS GIVEN DURING THIS VISIT:  Medications  diltiazem (CARDIZEM) tablet 120 mg (has no administration in time range)  simvastatin (ZOCOR) tablet 20 mg (has no administration in time range)  sotalol (BETAPACE) tablet 80 mg (has no administration in time range)  insulin glargine (LANTUS) injection 12 Units (12 Units Subcutaneous Given 11/29/19 1548)  ondansetron (ZOFRAN) injection 4 mg (has no administration in time range)  morphine 2 MG/ML injection 0.5 mg (has no administration in time range)  acetaminophen (TYLENOL) tablet 650 mg (has no administration in time range)  hydrALAZINE (APRESOLINE) injection 5 mg (has no administration in time range)  insulin aspart (novoLOG) injection 0-9 Units (3 Units Subcutaneous Given 11/29/19 1549)   stroke: mapping our early stages of recovery book (has no administration in time range)  senna-docusate (Senokot-S) tablet 1 tablet (has no administration in time range)  enoxaparin (LOVENOX) injection 40 mg (40 mg Subcutaneous Given 11/29/19 1551)  aspirin EC tablet 325 mg (has no administration in time range)  aspirin chewable tablet 324 mg (324 mg Oral Given 11/29/19 1205)     ED Discharge Orders    None       Note:  This document was prepared using Dragon voice recognition software and may include unintentional dictation errors.   Duffy Bruce, MD 11/29/19 [phone]

## 2019-11-30 ENCOUNTER — Encounter: Payer: Self-pay | Admitting: Internal Medicine

## 2019-11-30 ENCOUNTER — Observation Stay: Payer: Medicare HMO

## 2019-11-30 ENCOUNTER — Observation Stay (HOSPITAL_COMMUNITY)
Admit: 2019-11-30 | Discharge: 2019-11-30 | Disposition: A | Discharge status: Home / Self Care | Payer: Medicare HMO | Attending: Internal Medicine | Admitting: Internal Medicine

## 2019-11-30 DIAGNOSIS — I4891 Unspecified atrial fibrillation: Secondary | ICD-10-CM | POA: Diagnosis not present

## 2019-11-30 DIAGNOSIS — I340 Nonrheumatic mitral (valve) insufficiency: Secondary | ICD-10-CM | POA: Diagnosis not present

## 2019-11-30 DIAGNOSIS — I639 Cerebral infarction, unspecified: Secondary | ICD-10-CM | POA: Diagnosis not present

## 2019-11-30 LAB — GLUCOSE, CAPILLARY
Glucose-Capillary: 155 mg/dL — ABNORMAL HIGH (ref 70–99)
Glucose-Capillary: 128 mg/dL — ABNORMAL HIGH (ref 70–99)
Glucose-Capillary: 116 mg/dL — ABNORMAL HIGH (ref 70–99)
Glucose-Capillary: 166 mg/dL — ABNORMAL HIGH (ref 70–99)
Glucose-Capillary: 133 mg/dL — ABNORMAL HIGH (ref 70–99)
Glucose-Capillary: 111 mg/dL — ABNORMAL HIGH (ref 70–99)

## 2019-11-30 LAB — BASIC METABOLIC PANEL
Anion gap: 10 (ref 5–15)
BUN: 12 mg/dL (ref 8–23)
CO2: 28 mmol/L (ref 22–32)
Calcium: 8.9 mg/dL (ref 8.9–10.3)
Chloride: 100 mmol/L (ref 98–111)
Creatinine, Ser: 1.13 mg/dL — ABNORMAL HIGH (ref 0.44–1.00)
GFR calc Af Amer: 54 mL/min — ABNORMAL LOW (ref >60)
GFR calc non Af Amer: 47 mL/min — ABNORMAL LOW (ref >60)
Glucose, Bld: 111 mg/dL — ABNORMAL HIGH (ref 70–99)
Potassium: 3.4 mmol/L — ABNORMAL LOW (ref 3.5–5.1)
Sodium: 138 mmol/L (ref 135–145)

## 2019-11-30 LAB — LIPID PANEL
Cholesterol: 142 mg/dL (ref 0–200)
HDL: 61 mg/dL (ref >40)
LDL Cholesterol: 68 mg/dL (ref 0–99)
Total CHOL/HDL Ratio: 2.3 RATIO
Triglycerides: 67 mg/dL (ref <150)
VLDL: 13 mg/dL (ref 0–40)

## 2019-11-30 LAB — ECHOCARDIOGRAM COMPLETE
Height: 65 in
Weight: 3814.4 oz

## 2019-11-30 MED ORDER — LIDOCAINE 5 % EX PTCH
1.0000 | MEDICATED_PATCH | CUTANEOUS | Status: DC
  Administered 2019-11-30 – 2019-12-03 (×4): 1 via TRANSDERMAL
  Filled 2019-11-30 (×5): qty 1

## 2019-11-30 MED ORDER — ACETAMINOPHEN 325 MG PO TABS
650.0000 mg | ORAL_TABLET | Freq: Four times a day (QID) | ORAL | Status: DC | PRN
  Administered 2019-11-30 – 2019-12-03 (×2): 650 mg via ORAL
  Filled 2019-11-30 (×2): qty 2

## 2019-11-30 MED ORDER — POTASSIUM CHLORIDE CRYS ER 20 MEQ PO TBCR
40.0000 meq | EXTENDED_RELEASE_TABLET | Freq: Once | ORAL | Status: AC
  Administered 2019-11-30: 40 meq via ORAL
  Filled 2019-11-30: qty 2

## 2019-11-30 MED ORDER — IOHEXOL 350 MG/ML SOLN
100.0000 mL | Freq: Once | INTRAVENOUS | Status: AC | PRN
  Administered 2019-11-30: 100 mL via INTRAVENOUS

## 2019-11-30 NOTE — Progress Notes (Signed)
Inpatient Rehab Admissions Coordinator Note:   Per PT recommendations, pt was screened for CIR candidacy by Estill Dooms, PT, DPT. Noted pt is observation status at this time. Pt may not have the medical necessity to warrant an inpatient rehab stay if they remain observation. If pt were to qualify for inpatient status, AC will screen for candidacy.    Estill Dooms, PT, DPT [phone] 11/30/19 3:48 PM

## 2019-11-30 NOTE — Progress Notes (Signed)
*  PRELIMINARY RESULTS* Echocardiogram 2D Echocardiogram has been performed.  Tanya Aguirre 11/30/2019, 9:07 AM

## 2019-11-30 NOTE — Progress Notes (Signed)
PROGRESS NOTE    Tanya Aguirre  JKD:[id number] DOB: 1941-05-11 DOA: 11/29/2019 PCP: Alan Mulder, MD   Brief Narrative:  HPI: Tanya Aguirre is a 79 y.o. female with medical history significant of hypertension, hyperlipidemia, diabetes mellitus, atrial fibrillation not on anticoagulants, who presents with dizziness, nausea vomiting and abdominal pain.  Per her daughter-in-law, patient started having dizziness and a difficulty walking at about 10 PM last night.  She also has some mild slurred speech, but no vision loss or hearing loss.  Patient does not have unilateral numbness or weakness in extremities.  Patient has headache.  She also reports nausea, vomiting and abdominal pain.  She has vomited several times with nonbloody nonbilious vomitus.  Her abdominal pain is located in the central abdomen, constant, aching, moderate, nonradiating.  No diarrhea.  No fever or chills.  No symptoms of UTI.  Patient does not have chest pain, shortness breath, cough  5/21: Patient seen and examined.  Spouse and daughter at bedside.  Patient reports feeling better.  MRI brain shows moderate sized infarct.  Follow-up by neurology appreciated.  PT and OT consults pending.   Assessment & Plan:   Principal Problem:   Stroke [facility]) Active Problems:   A-fib (HCC)   Diabetes mellitus without complication (HCC)   Hypertension   Abdominal pain   HLD (hyperlipidemia)  Acute CVA  Ct-head showed possible right cerebellar infarct MRI confirms moderate size infarct Patient has known history of atrial fibrillation but was not on anticoagulation for unclear reasons .  Neurology, Dr. Loretha Brasil is consulted Plan: ASA daily.  Eliquis to be started on day 5 CTA head ordered.  Abnormalities may require anticoagulation and antiplatelet therapy PT and OT consults Discharge planning based on results   A-fib [facility])  pt is not on anticoagulants at home, unclear reason CHA2DS2-VASc is 7 Plan: -on ASA.   Plan to introduce Eliquis within 5 days -Continue sotalol and Cardizem   Diabetes mellitus without complication (HCC) Most recent A1c not on record.  Patient is taking Metformin and glargine insulin 12-20 unit daily at home Plan: Glargine 12 units daily SSI Carb modified diet   Hypertension -Hold Lasix and amlodipine to allow permissive hypertension -As needed hydralazine for SBP>220 or dBP > 120 -pt is on Cardizem and sotalol for afib  Abdominal pain, resolved  Patient has nausea vomiting and central abdominal pain.  Lipase 34.  Etiology is not clear. - CT abd no etiology -As needed Zofran and morphine  HLD (hyperlipidemia): -zocor  Pulmonary nodules Small and multiple Noted on CT abd Unclear etiology but concerning for metastatic disease Ordered dedicated CT chest with contrast   DVT prophylaxis: Lovenox Code Status: Full Family Communication: Spouse and daughter at bedside Disposition Plan: Status is: Observation  The patient remains OBS appropriate and will d/c before 2 midnights.  Dispo: The patient is from: Home              Anticipated d/c is to: Home              Anticipated d/c date is: 1 day              Patient currently is not medically stable to d/c.  Further work-up in progress for acute CVA      Consultants:   Neurology, Loretha Brasil  Procedures:   2D echo  Antimicrobials:   None   Subjective: Seen and examined.  2 family members at bedside.  Patient in no visible distress.  No  new complaints.  And does endorse some residual left-sided weakness.  Objective: Vitals:   11/30/19 0500 11/30/19 0600 11/30/19 0758 11/30/19 1213  BP:   (!) 139/53 (!) 151/84  Pulse:   78 80  Resp: (!) 24 12  14   Temp:   98.5 F (36.9 C) 98.6 F (37 C)  TempSrc:   Oral Oral  SpO2:   97% 98%  Weight:      Height:        Intake/Output Summary (Last 24 hours) at 11/30/2019 1225 Last data filed at 11/30/2019 [id number] Gross per 24 hour  Intake 120 ml   Output 200 ml  Net -80 ml   Filed Weights   11/29/19 0908 11/29/19 2240 11/30/19 0423  Weight: 108.9 kg 107.6 kg 108.1 kg    Examination:  General exam: Appears calm and comfortable  Respiratory system: Clear to auscultation. Respiratory effort normal. Cardiovascular system: S1 & S2 heard, RRR. No JVD, murmurs, rubs, gallops or clicks. No pedal edema. Gastrointestinal system: Abdomen is nondistended, soft and nontender. No organomegaly or masses felt. Normal bowel sounds heard. Central nervous system: Alert and oriented. No focal neurological deficits. Extremities: Symmetric 5 x 5 power. Skin: No rashes, lesions or ulcers Psychiatry: Judgement and insight appear normal. Mood & affect appropriate.     Data Reviewed: I have personally reviewed following labs and imaging studies  CBC: Recent Labs  Lab 11/29/19 0923  WBC 9.8  HGB 11.9*  HCT 36.4  MCV 90.8  PLT 230   Basic Metabolic Panel: Recent Labs  Lab 11/29/19 0923  NA 134*  K 3.8  CL 97*  CO2 23  GLUCOSE 274*  BUN 16  CREATININE 1.04*  CALCIUM 9.0   GFR: Estimated Creatinine Clearance: 54.5 mL/min (A) (by C-G formula based on SCr of 1.04 mg/dL (H)). Liver Function Tests: No results for input(s): AST, ALT, ALKPHOS, BILITOT, PROT, ALBUMIN in the last 168 hours. Recent Labs  Lab 11/29/19 0923  LIPASE 34   No results for input(s): AMMONIA in the last 168 hours. Coagulation Profile: No results for input(s): INR, PROTIME in the last 168 hours. Cardiac Enzymes: No results for input(s): CKTOTAL, CKMB, CKMBINDEX, TROPONINI in the last 168 hours. BNP (last 3 results) No results for input(s): PROBNP in the last 8760 hours. HbA1C: No results for input(s): HGBA1C in the last 72 hours. CBG: Recent Labs  Lab 11/29/19 2032 11/29/19 2345 11/30/19 0419 11/30/19 0737 11/30/19 1215  GLUCAP 154* 166* 155* 128* 116*   Lipid Profile: Recent Labs    11/30/19 0414  CHOL 142  HDL 61  LDLCALC 68  TRIG 67   CHOLHDL 2.3   Thyroid Function Tests: No results for input(s): TSH, T4TOTAL, FREET4, T3FREE, THYROIDAB in the last 72 hours. Anemia Panel: No results for input(s): VITAMINB12, FOLATE, FERRITIN, TIBC, IRON, RETICCTPCT in the last 72 hours. Sepsis Labs: No results for input(s): PROCALCITON, LATICACIDVEN in the last 168 hours.  Recent Results (from the past 240 hour(s))  SARS Coronavirus 2 by RT PCR (hospital order, [facility]) Nasopharyngeal Nasopharyngeal Swab     Status: None   Collection Time: 11/29/19 12:07 PM   Specimen: Nasopharyngeal Swab  Result Value Ref Range Status   SARS Coronavirus 2 NEGATIVE NEGATIVE Final    Comment: (NOTE) SARS-CoV-2 target nucleic acids are NOT DETECTED. The SARS-CoV-2 RNA is generally detectable in upper and lower respiratory specimens during the acute phase of infection. The lowest concentration of SARS-CoV-2 viral copies this assay can  detect is 250 copies / mL. A negative result does not preclude SARS-CoV-2 infection and should not be used as the sole basis for treatment or other patient management decisions.  A negative result may occur with improper specimen collection / handling, submission of specimen other than nasopharyngeal swab, presence of viral mutation(s) within the areas targeted by this assay, and inadequate number of viral copies (<250 copies / mL). A negative result must be combined with clinical observations, patient history, and epidemiological information. Fact Sheet for Patients:   BoilerBrush.com.cy Fact Sheet for Healthcare Providers: https://pope.com/ This test is not yet approved or cleared  by the [place] FDA and has been authorized for detection and/or diagnosis of SARS-CoV-2 by FDA under an Emergency Use Authorization (EUA).  This EUA will remain in effect (meaning this test can be used) for the duration of the COVID-19 declaration under  Section 564(b)(1) of the Act, 21 U.S.C. section 360bbb-3(b)(1), unless the authorization is terminated or revoked sooner. Performed at [facility], [address], [place], [place] [address]          Radiology Studies: CT HEAD WO CONTRAST  Result Date: 11/29/2019 CLINICAL DATA:  Recent fall with facial pain, initial encounter EXAM: CT HEAD WITHOUT CONTRAST TECHNIQUE: Contiguous axial images were obtained from the base of the skull through the vertex without intravenous contrast. COMPARISON:  None. FINDINGS: Brain: There is a geographic area of decreased attenuation identified within the right cerebellar hemisphere consistent with acute to subacute ischemia. Mild atrophic changes are noted commenced with the patient's given age. No findings to suggest acute hemorrhage or space-occupying mass lesion are noted. Vascular: No hyperdense vessel or unexpected calcification. Skull: Normal. Negative for fracture or focal lesion. Sinuses/Orbits: No acute finding. Other: None. IMPRESSION: Geographic area of decreased attenuation within the right cerebellar hemisphere superiorly consistent with acute to subacute ischemia. MRI may be helpful further evaluation. Electronically Signed   By: Alcide Clever M.D.   On: 11/29/2019 09:34   MR BRAIN WO CONTRAST  Result Date: 11/29/2019 CLINICAL DATA:  Abnormal CT EXAM: MRI HEAD WITHOUT CONTRAST TECHNIQUE: Multiplanar, multiecho pulse sequences of the brain and surrounding structures were obtained without intravenous contrast. COMPARISON:  Correlation made with CT earlier same day FINDINGS: Motion artifact is present. Brain: Moderate size acute infarction of the right cerebellar hemisphere. Subcentimeter acute infarct of the left cerebellum. There is no significant mass effect or hydrocephalus. Chronic inferior right frontal infarct. Punctate focus of susceptibility adjacent to the posterior body of the right lateral ventricle compatible with chronic  microhemorrhage or less likely mineralization. Patchy T2 hyperintensity in the supratentorial white matter is nonspecific but may reflect mild chronic microvascular ischemic changes. Prominence of the ventricles and sulci reflects minor generalized parenchymal volume loss. There is no intracranial mass, mass effect, or edema. There is no hydrocephalus or extra-axial fluid collection. No abnormal enhancement. Vascular: Major vessel flow voids at the skull base are preserved. Skull and upper cervical spine: Normal marrow signal is preserved. Sinuses/Orbits: Minor mucosal thickening.  Orbits are unremarkable. Other: Sella is unremarkable.  Mastoid air cells are clear. 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 Subcentimeter acute infarct of the left cerebellum. No significant mass effect or hydrocephalus. Chronic inferior right frontal infarct. Mild chronic microvascular ischemic changes. Electronically Signed   By: Guadlupe Spanish M.D.   On: 11/29/2019 15:20   CT ABDOMEN PELVIS W CONTRAST  Result Date: 11/29/2019 CLINICAL DATA:  Nausea and vomiting. EXAM: CT ABDOMEN AND PELVIS WITH  CONTRAST TECHNIQUE: Multidetector CT imaging of the abdomen and pelvis was performed using the standard protocol following bolus administration of intravenous contrast. CONTRAST:  OMNIPAQUE IOHEXOL 300 MG/ML  SOLN COMPARISON:  None. FINDINGS: Lower chest: Numerous ill-defined subcentimeter lung nodules are seen scattered throughout both lungs. Hepatobiliary: No focal liver abnormality is seen. Subcentimeter gallstones are seen within the gallbladder lumen without gallbladder wall thickening, or biliary dilatation. Pancreas: Unremarkable. No pancreatic ductal dilatation or surrounding inflammatory changes. Spleen: Normal in size without focal abnormality. Adrenals/Urinary Tract: Adrenal glands are unremarkable. Kidneys are normal, without renal calculi, focal lesion, or hydronephrosis. Bladder is  unremarkable. Stomach/Bowel: Stomach is within normal limits. Appendix appears normal. No evidence of bowel wall thickening, distention, or inflammatory changes. Noninflamed diverticula are seen throughout the sigmoid colon. Vascular/Lymphatic: There is moderate severity calcification of the abdominal aorta. No enlarged abdominal or pelvic lymph nodes. Reproductive: Multiple calcified fibroids of various sizes are seen within the uterus. An ill-defined 1.6 cm right adnexal cyst is noted. Other: There is a 6.0 cm x 4.9 cm fat containing umbilical hernia. Musculoskeletal: A chronic appearing deformity is seen along the anterior aspect of the superior endplate of the L4 vertebral body. Moderate to marked severity degenerative changes are seen at the levels of L4-L5 and L5-S1. IMPRESSION: 1. Numerous ill-defined subcentimeter lung nodules scattered throughout both lungs worrisome for metastatic disease. 2. Cholelithiasis without evidence of acute cholecystitis. 3. 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 4. 6.0 cm x 4.9 cm fat containing umbilical hernia. 5. Chronic appearing deformity along the anterior aspect of the superior endplate of the L4 vertebral body. 6. Moderate to marked severity degenerative changes at the levels of L4-L5 and L5-S1. Aortic Atherosclerosis (ICD10-I70.0). Electronically Signed   By: Aram Candela M.D.   On: 11/29/2019 18:40   US Carotid Bilateral (at [facility] and [facility] only)  Result Date: 11/29/2019 CLINICAL DATA:  79 year old female with a history of stroke symptoms EXAM: BILATERAL CAROTID DUPLEX ULTRASOUND TECHNIQUE: Wallace Cullens scale imaging, color Doppler and duplex ultrasound were performed of bilateral carotid and vertebral arteries in the neck. COMPARISON:  None. FINDINGS: Criteria: Quantification of carotid stenosis is based on velocity parameters that correlate the residual internal carotid diameter with NASCET-based stenosis levels, using the diameter of the distal  internal carotid lumen as the denominator for stenosis measurement. The following velocity measurements were obtained: RIGHT ICA:  Systolic 102 cm/sec, Diastolic 30 cm/sec CCA:  92 cm/sec SYSTOLIC ICA/CCA RATIO:  1.1 ECA:  83 cm/sec LEFT ICA:  Systolic 70 cm/sec, Diastolic 12 cm/sec CCA:  56 cm/sec SYSTOLIC ICA/CCA RATIO:  1.3 ECA:  114 cm/sec Right Brachial SBP: Not acquired Left Brachial SBP: Not acquired RIGHT CAROTID ARTERY: No significant calcifications of the right common carotid artery. Intermediate waveform maintained. Heterogeneous and partially calcified plaque at the right carotid bifurcation. No significant lumen shadowing. Low resistance waveform of the right ICA. No significant tortuosity. RIGHT VERTEBRAL ARTERY: Antegrade flow with low resistance waveform. LEFT CAROTID ARTERY: No significant calcifications of the left common carotid artery. Intermediate waveform maintained. Heterogeneous and partially calcified plaque at the left carotid bifurcation without significant lumen shadowing. Low resistance waveform of the left ICA. No significant tortuosity. LEFT VERTEBRAL ARTERY:  Antegrade flow with low resistance waveform. IMPRESSION: Color duplex indicates minimal heterogeneous and calcified plaque, with no hemodynamically significant stenosis by duplex criteria in the extracranial cerebrovascular circulation. Signed, Yvone Neu. Reyne Dumas, RPVI [facility] Radiology Electronically Signed   By: Marijean Niemann  Earleen Newport D.O.   On: 11/29/2019 14:27        Scheduled Meds: . aspirin EC  325 mg Oral Daily  . diltiazem  120 mg Oral QHS  . enoxaparin (LOVENOX) injection  40 mg Subcutaneous Q24H  . insulin aspart  0-9 Units Subcutaneous Q4H  . insulin glargine  12 Units Subcutaneous Daily  . lidocaine  1 patch Transdermal Q24H  . simvastatin  20 mg Oral QHS  . sotalol  80 mg Oral QHS   Continuous Infusions:   LOS: 0 days    Time spent: 35  minutes    Sidney Ace, MD [facility] Pager [phone]  If 7PM-7AM, please contact night-coverage 11/30/2019, 12:25 PM

## 2019-11-30 NOTE — Evaluation (Addendum)
Physical Therapy Evaluation Patient Details Name: Tanya Aguirre MRN: [id number] DOB: October 25, 1940 Today's Date: 11/30/2019   History of Present Illness  presented to ER secondary to acute onset of dizziness, nausea/vomiting, abdominal pain; admitted for TIA/CVA work up.  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  Clinical Impression  Upon evaluation, patient alert and oriented; follows commands and agreeable to participation with session.  Husband/daughter in law present at bedside, supportive and encouraging throughout.  Patient alert and oriented to all information; follows commands, mildly impulsive with mobility tasks at times.  Strength and mobility assessment significant for mild weakness L LE (4-/5); mild/mod ataxia/dysmetrica R UE > LE; delayed speed of activation and overall control L UE > LE.  Denies sensory deficit throughout.  Requiring 36 seconds for 5x sit/stand and demonstrates only 1-2" standing functional reach; indicative of increased fall risk with all functional activities.  Brief visual/vestibular screen unremarkable; good occular ROM, conjugate gaze with no noted nystagmus or reports of dizziness with testing.  Vitals stable and WFL throughout session (no orthostasis noted). Currently requiring sup/mod indep for bed mobility; min/mod assist for sit/stand and mod assist for gait (5') without assist device.  Unsupported gait generally ataxic and unsteady, poor trunk control, inconsistent LE placement; improved with use of RW.  Do recommend continued use of RW and +1 for optimal safety/indep with functional activities at this time. Would benefit from skilled PT to address above deficits and promote optimal return to PLOF.; recommend transition to acute inpatient rehab upon discharge for high-intensity, post-acute rehab services.      Follow Up Recommendations CIR    Equipment Recommendations  Rolling walker with 5" wheels;3in1 (PT)     Recommendations for Other Services       Precautions / Restrictions Precautions Precautions: Fall Restrictions Weight Bearing Restrictions: No      Mobility  Bed Mobility Overal bed mobility: Modified Independent                Transfers Overall transfer level: Needs assistance   Transfers: Sit to/from Stand Sit to Stand: Min assist         General transfer comment: performed with and without assist device, min assist; does require UE support for lift off and overall stabilization; increased sway in A/P plane with initial transition to upright  Ambulation/Gait Ambulation/Gait assistance: Mod assist Gait Distance (Feet): 5 Feet Assistive device: None       General Gait Details: staggered, ataxic foot placement with increased sway, poor trunk stability, in all planes; mod assist for balance and safety  Stairs            Wheelchair Mobility    Modified Rankin (Stroke Patients Only)       Balance Overall balance assessment: Needs assistance Sitting-balance support: No upper extremity supported;Feet supported Sitting balance-Leahy Scale: Good     Standing balance support: No upper extremity supported Standing balance-Leahy Scale: Poor Standing balance comment: standing functional reach 1-2" from immediate BOS; generally fearful of falling                             Pertinent Vitals/Pain Pain Assessment: No/denies pain    Home Living Family/patient expects to be discharged to:: Private residence Living Arrangements: Spouse/significant other Available Help at Discharge: Family Type of Home: House Home Access: Stairs to enter   [employer] of Steps: 2 steps without rails to front entrance; full flight from basement, R  ascending rail Home Layout: Two level;Laundry or work area in basement;Full bath on main level Home Equipment: None      Prior Function Level of Independence: Independent               Hand  Dominance        Extremity/Trunk Assessment   Upper Extremity Assessment Upper Extremity Assessment: (strength grossly 4+/5; mild/mod ataxia R UE, delayed speed of activation and overall control L LE)    Lower Extremity Assessment Lower Extremity Assessment: (R LE grossly 4+/5, L LE grossly 4-/5; denies sensory deficit; mild ataxia bilat LEs)       Communication   Communication: (mildly slurred, dysarthric)  Cognition Arousal/Alertness: Awake/alert Behavior During Therapy: WFL for tasks assessed/performed Overall Cognitive Status: Within Functional Limits for tasks assessed                                        General Comments      Exercises Other Exercises Other Exercises: 5x sit/stand without assist device, 36 seconds, increased compared to age-matched norms and indicative of increased fall risk.  Increased sway in A/P plane; decreased eccentric control with stand to sit Other Exercises: Toilet transfer, ambulatory with RW, min assist; min cuing for safety, walker position and management.  Quick to release RW with tasks, often attempting reach outside BOS (requiring UE support and +1 assist for stabilization_ Other Exercises: 70' with RW, min assist-improved core activation and overall stability with use of assist device; continues with higher level balance deficits, slow and broad turning radius.  Do recommend continued use of RW and +1 at this time.   Assessment/Plan    PT Assessment Patient needs continued PT services  PT Problem List Decreased strength;Decreased range of motion;Decreased activity tolerance;Decreased balance;Decreased mobility;Decreased coordination;Decreased cognition;Decreased knowledge of use of DME;Decreased safety awareness;Decreased knowledge of precautions       PT Treatment Interventions DME instruction;Gait training;Stair training;Functional mobility training;Therapeutic activities;Therapeutic exercise;Balance  training;Neuromuscular re-education;Cognitive remediation;Patient/family education    PT Goals (Current goals can be found in the Care Plan section)  Acute Rehab PT Goals Patient Stated Goal: to return home when able PT Goal Formulation: With patient/family Time For Goal Achievement: 12/14/19 Potential to Achieve Goals: Good    Frequency 7X/week   Barriers to discharge Decreased caregiver support      Co-evaluation               AM-PAC PT "6 Clicks" Mobility  Outcome Measure Help needed turning from your back to your side while in a flat bed without using bedrails?: None Help needed moving from lying on your back to sitting on the side of a flat bed without using bedrails?: None Help needed moving to and from a bed to a chair (including a wheelchair)?: A Lot Help needed standing up from a chair using your arms (e.g., wheelchair or bedside chair)?: A Lot Help needed to walk in hospital room?: A Lot Help needed climbing 3-5 steps with a railing? : A Lot 6 Click Score: 16    End of Session Equipment Utilized During Treatment: Gait belt Activity Tolerance: Patient tolerated treatment well Patient left: in bed;with call bell/phone within reach;with bed alarm set;with family/visitor present Nurse Communication: Mobility status PT Visit Diagnosis: Muscle weakness (generalized) (M62.81);Difficulty in walking, not elsewhere classified (R26.2);Hemiplegia and hemiparesis Hemiplegia - Right/Left: Right Hemiplegia - dominant/non-dominant: Dominant Hemiplegia - caused by: Cerebral infarction  Time: 1331-1411 PT Time Calculation (min) (ACUTE ONLY): 40 min   Charges:   PT Evaluation $PT Eval Moderate Complexity: 1 Mod PT Treatments $Gait Training: 8-22 mins $Therapeutic Activity: 8-22 mins        Curvin Hunger H. Manson Passey, PT, DPT, NCS 11/30/19, 2:42 PM [phone]

## 2019-11-30 NOTE — Progress Notes (Addendum)
Subjective: states feels better. No dizziness when laying flat   Past Medical History:  Diagnosis Date  . A-fib (HCC)   . Diabetes mellitus without complication (HCC)   . Hypertension     Past Surgical History:  Procedure Laterality Date  . BREAST LUMPECTOMY Right     Family History  Problem Relation Age of Onset  . Hypertension Father   . Hypertension Sister   . Diabetes Mellitus II Brother     Social History:  reports that she has never smoked. She has never used smokeless tobacco. She reports that she does not drink alcohol or use drugs.  No Known Allergies  Medications: I have reviewed the patient's current medications.   Physical Examination: Blood pressure (!) 151/84, pulse 80, temperature 98.6 F (37 C), temperature source Oral, resp. rate 14, height 5\' 5"  (1.651 m), weight 108.1 kg, SpO2 98 %.   Neurological Examination   Mental Status: Alert, oriented, thought content appropriate.  Speech fluent without evidence of aphasia.  Able to follow 3 step commands without difficulty. Cranial Nerves: II: Discs flat bilaterally; Visual fields grossly normal, pupils equal, round, reactive to light and accommodation III,IV, VI: ptosis not present, extra-ocular motions intact bilaterally V,VII: smile symmetric, facial light touch sensation normal bilaterally VIII: hearing normal bilaterally IX,X: gag reflex present XI: bilateral shoulder shrug XII: midline tongue extension Motor: Right : Upper extremity   5/5    Left:     Upper extremity   5/5  Lower extremity   5/5     Lower extremity   5/5 Tone and bulk:normal tone throughout; no atrophy noted Sensory: Pinprick and light touch intact throughout, bilaterally Deep Tendon Reflexes: 2+ and symmetric throughout Plantars: Right: downgoing   Left: downgoing Cerebellar: normal finger-to-nose Gait: not tested      Laboratory Studies:   Basic Metabolic Panel: Recent Labs  Lab 11/29/19 0923  NA 134*  K 3.8  CL 97*   CO2 23  GLUCOSE 274*  BUN 16  CREATININE 1.04*  CALCIUM 9.0    Liver Function Tests: No results for input(s): AST, ALT, ALKPHOS, BILITOT, PROT, ALBUMIN in the last 168 hours. Recent Labs  Lab 11/29/19 0923  LIPASE 34   No results for input(s): AMMONIA in the last 168 hours.  CBC: Recent Labs  Lab 11/29/19 0923  WBC 9.8  HGB 11.9*  HCT 36.4  MCV 90.8  PLT 230    Cardiac Enzymes: No results for input(s): CKTOTAL, CKMB, CKMBINDEX, TROPONINI in the last 168 hours.  BNP: Invalid input(s): POCBNP  CBG: Recent Labs  Lab 11/29/19 2032 11/29/19 2345 11/30/19 0419 11/30/19 0737 11/30/19 1215  GLUCAP 154* 166* 155* 128* 116*    Microbiology: Results for orders placed or performed during the hospital encounter of 11/29/19  SARS Coronavirus 2 by RT PCR (hospital order, performed in [facility] hospital lab) Nasopharyngeal Nasopharyngeal Swab     Status: None   Collection Time: 11/29/19 12:07 PM   Specimen: Nasopharyngeal Swab  Result Value Ref Range Status   SARS Coronavirus 2 NEGATIVE NEGATIVE Final    Comment: (NOTE) SARS-CoV-2 target nucleic acids are NOT DETECTED. The SARS-CoV-2 RNA is generally detectable in upper and lower respiratory specimens during the acute phase of infection. The lowest concentration of SARS-CoV-2 viral copies this assay can detect is 250 copies / mL. A negative result does not preclude SARS-CoV-2 infection and should not be used as the sole basis for treatment or other patient management decisions.  A negative result may  occur with improper specimen collection / handling, submission of specimen other than nasopharyngeal swab, presence of viral mutation(s) within the areas targeted by this assay, and inadequate number of viral copies (<250 copies / mL). A negative result must be combined with clinical observations, patient history, and epidemiological information. Fact Sheet for Patients:    BoilerBrush.com.cy Fact Sheet for Healthcare Providers: https://pope.com/ This test is not yet approved or cleared  by the [place] FDA and has been authorized for detection and/or diagnosis of SARS-CoV-2 by FDA under an Emergency Use Authorization (EUA).  This EUA will remain in effect (meaning this test can be used) for the duration of the COVID-19 declaration under Section 564(b)(1) of the Act, 21 U.S.C. section 360bbb-3(b)(1), unless the authorization is terminated or revoked sooner. Performed at [facility], [address] [place], [place] [address]     Coagulation Studies: No results for input(s): LABPROT, INR in the last 72 hours.  Urinalysis: No results for input(s): COLORURINE, LABSPEC, PHURINE, GLUCOSEU, HGBUR, BILIRUBINUR, KETONESUR, PROTEINUR, UROBILINOGEN, NITRITE, LEUKOCYTESUR in the last 168 hours.  Invalid input(s): APPERANCEUR  Lipid Panel:     Component Value Date/Time   CHOL 142 11/30/2019 0414   TRIG 67 11/30/2019 0414   HDL 61 11/30/2019 0414   CHOLHDL 2.3 11/30/2019 0414   VLDL 13 11/30/2019 0414   LDLCALC 68 11/30/2019 0414    HgbA1C: No results found for: HGBA1C  Urine Drug Screen:  No results found for: LABOPIA, COCAINSCRNUR, LABBENZ, AMPHETMU, THCU, LABBARB  Alcohol Level: No results for input(s): ETH in the last 168 hours.  Other results: EKG: A-fib rate of 101.  Imaging: CT HEAD WO CONTRAST  Result Date: 11/29/2019 CLINICAL DATA:  Recent fall with facial pain, initial encounter EXAM: CT HEAD WITHOUT CONTRAST TECHNIQUE: Contiguous axial images were obtained from the base of the skull through the vertex without intravenous contrast. COMPARISON:  None. FINDINGS: Brain: There is a geographic area of decreased attenuation identified within the right cerebellar hemisphere consistent with acute to subacute ischemia. Mild atrophic changes are noted commenced with the patient's given age.  No findings to suggest acute hemorrhage or space-occupying mass lesion are noted. Vascular: No hyperdense vessel or unexpected calcification. Skull: Normal. Negative for fracture or focal lesion. Sinuses/Orbits: No acute finding. Other: None. IMPRESSION: Geographic area of decreased attenuation within the right cerebellar hemisphere superiorly consistent with acute to subacute ischemia. MRI may be helpful further evaluation. Electronically Signed   By: Alcide Clever M.D.   On: 11/29/2019 09:34   MR BRAIN WO CONTRAST  Result Date: 11/29/2019 CLINICAL DATA:  Abnormal CT EXAM: MRI HEAD WITHOUT CONTRAST TECHNIQUE: Multiplanar, multiecho pulse sequences of the brain and surrounding structures were obtained without intravenous contrast. COMPARISON:  Correlation made with CT earlier same day FINDINGS: Motion artifact is present. Brain: Moderate size acute infarction of the right cerebellar hemisphere. Subcentimeter acute infarct of the left cerebellum. There is no significant mass effect or hydrocephalus. Chronic inferior right frontal infarct. Punctate focus of susceptibility adjacent to the posterior body of the right lateral ventricle compatible with chronic microhemorrhage or less likely mineralization. Patchy T2 hyperintensity in the supratentorial white matter is nonspecific but may reflect mild chronic microvascular ischemic changes. Prominence of the ventricles and sulci reflects minor generalized parenchymal volume loss. There is no intracranial mass, mass effect, or edema. There is no hydrocephalus or extra-axial fluid collection. No abnormal enhancement. Vascular: Major vessel flow voids at the skull base are preserved. Skull and upper cervical spine: Normal marrow  signal is preserved. Sinuses/Orbits: Minor mucosal thickening.  Orbits are unremarkable. Other: Sella is unremarkable.  Mastoid air cells are clear. 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 Subcentimeter acute infarct of  the left cerebellum. No significant mass effect or hydrocephalus. Chronic inferior right frontal infarct. Mild chronic microvascular ischemic changes. Electronically Signed   By: Macy Mis M.D.   On: 11/29/2019 15:20   CT ABDOMEN PELVIS W CONTRAST  Result Date: 11/29/2019 CLINICAL DATA:  Nausea and vomiting. EXAM: CT ABDOMEN AND PELVIS WITH CONTRAST TECHNIQUE: Multidetector CT imaging of the abdomen and pelvis was performed using the standard protocol following bolus administration of intravenous contrast. CONTRAST:  160mL OMNIPAQUE IOHEXOL 300 MG/ML  SOLN COMPARISON:  None. FINDINGS: Lower chest: Numerous ill-defined subcentimeter lung nodules are seen scattered throughout both lungs. Hepatobiliary: No focal liver abnormality is seen. Subcentimeter gallstones are seen within the gallbladder lumen without gallbladder wall thickening, or biliary dilatation. Pancreas: Unremarkable. No pancreatic ductal dilatation or surrounding inflammatory changes. Spleen: Normal in size without focal abnormality. Adrenals/Urinary Tract: Adrenal glands are unremarkable. Kidneys are normal, without renal calculi, focal lesion, or hydronephrosis. Bladder is unremarkable. Stomach/Bowel: Stomach is within normal limits. Appendix appears normal. No evidence of bowel wall thickening, distention, or inflammatory changes. Noninflamed diverticula are seen throughout the sigmoid colon. Vascular/Lymphatic: There is moderate severity calcification of the abdominal aorta. No enlarged abdominal or pelvic lymph nodes. Reproductive: Multiple calcified fibroids of various sizes are seen within the uterus. An ill-defined 1.6 cm right adnexal cyst is noted. Other: There is a 6.0 cm x 4.9 cm fat containing umbilical hernia. Musculoskeletal: A chronic appearing deformity is seen along the anterior aspect of the superior endplate of the L4 vertebral body. Moderate to marked severity degenerative changes are seen at the levels of L4-L5 and L5-S1.  IMPRESSION: 1. Numerous ill-defined subcentimeter lung nodules scattered throughout both lungs worrisome for metastatic disease. 2. Cholelithiasis without evidence of acute cholecystitis. 3. 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 4. 6.0 cm x 4.9 cm fat containing umbilical hernia. 5. Chronic appearing deformity along the anterior aspect of the superior endplate of the L4 vertebral body. 6. Moderate to marked severity degenerative changes at the levels of L4-L5 and L5-S1. Aortic Atherosclerosis (ICD10-I70.0). Electronically Signed   By: Virgina Norfolk M.D.   On: 11/29/2019 18:40   US Carotid Bilateral (at [facility] and [facility] only)  Result Date: 11/29/2019 CLINICAL DATA:  79 year old female with a history of stroke symptoms EXAM: BILATERAL CAROTID DUPLEX ULTRASOUND TECHNIQUE: Pearline Cables scale imaging, color Doppler and duplex ultrasound were performed of bilateral carotid and vertebral arteries in the neck. COMPARISON:  None. FINDINGS: Criteria: Quantification of carotid stenosis is based on velocity parameters that correlate the residual internal carotid diameter with NASCET-based stenosis levels, using the diameter of the distal internal carotid lumen as the denominator for stenosis measurement. The following velocity measurements were obtained: RIGHT ICA:  Systolic 737 cm/sec, Diastolic 30 cm/sec CCA:  92 cm/sec SYSTOLIC ICA/CCA RATIO:  1.1 ECA:  83 cm/sec LEFT ICA:  Systolic 70 cm/sec, Diastolic 12 cm/sec CCA:  56 cm/sec SYSTOLIC ICA/CCA RATIO:  1.3 ECA:  114 cm/sec Right Brachial SBP: Not acquired Left Brachial SBP: Not acquired RIGHT CAROTID ARTERY: No significant calcifications of the right common carotid artery. Intermediate waveform maintained. Heterogeneous and partially calcified plaque at the right carotid bifurcation. No significant lumen shadowing. Low resistance waveform of the right ICA. No significant tortuosity. RIGHT VERTEBRAL ARTERY: Antegrade flow with low resistance waveform.  LEFT CAROTID ARTERY: No significant calcifications of the left common carotid artery. Intermediate waveform maintained. Heterogeneous and partially calcified plaque at the left carotid bifurcation without significant lumen shadowing. Low resistance waveform of the left ICA. No significant tortuosity. LEFT VERTEBRAL ARTERY:  Antegrade flow with low resistance waveform. IMPRESSION: Color duplex indicates minimal heterogeneous and calcified plaque, with no hemodynamically significant stenosis by duplex criteria in the extracranial cerebrovascular circulation. Signed, Yvone Neu. Reyne Dumas, RPVI [facility] Radiology Electronically Signed   By: Gilmer Mor D.O.   On: 11/29/2019 14:27     Assessment/Plan: 79 y.o. femalewith past medical history of hypertension, diabetes, A. fib, here with dizziness and difficulty walking.  The patient states her symptoms started around 10:00- 10:30 last nigh that are associated with N/V.  She states that she was at rest, when she began to feel very dizzy and weak. She states occasionally she would take ASA but nor regularly.  She does not take aspirin.  History of A. Fib that is known to her and primary physician.  She is on B blocker for rate control but no anticoagulation.   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  CTH only showed R cerebellar  - Started on ASA daily. Eliquis to be started on day 5 given the likely hood of strokes in setting of A-fib - CTA ordered as would like to see if there abnormalities with basilar at which point she might need anticoagulation and ASA 81mg .   - d/c planning based on pt/ot  Addendum: CTA reviewed. Open basilar artery. No need for anti platelet therapy along with anticoagulation   11/30/2019, 12:17 PM

## 2019-11-30 NOTE — Evaluation (Signed)
Speech Language Pathology Evaluation Patient Details Name: Tanya Aguirre MRN: [id number] DOB: Jan 26, 1941 Today's Date: 11/30/2019 Time: 9528-4132 SLP Time Calculation (min) (ACUTE ONLY): 32 min  Problem List:  Patient Active Problem List   Diagnosis Date Noted  . A-fib (HCC)   . Diabetes mellitus without complication (HCC)   . Hypertension   . Stroke (HCC)   . Abdominal pain   . HLD (hyperlipidemia)    Past Medical History:  Past Medical History:  Diagnosis Date  . A-fib (HCC)   . Diabetes mellitus without complication (HCC)   . Hypertension    Past Surgical History:  Past Surgical History:  Procedure Laterality Date  . BREAST LUMPECTOMY Right    HPI:  Tanya Aguirre is a 79 y.o. female with medical history significant of hypertension, hyperlipidemia, diabetes mellitus, atrial fibrillation not on anticoagulants, who presents with dizziness, nausea vomiting and abdominal pain. MRI revealed moderate size acute infarction of the right cerebellum, subcentimeter acute infarct of the left cerebellum with no significant mass effect or hydrocephalus. Also revealed chronic inferior right frontal infarct and mild chronic microvascular ischemic changes.   Assessment / Plan / Recommendation Clinical Impression  Pt presents with overall intact cognitive communication abilities as evidenced by A & O x 4, appropriate attention, comprehension, expression and problem solving. Pt does present with mild dysarthria that is c/b mild intermittent slurring at the conversation level. As a result, pt's speech intelligibility is reduced over baseline. If pt's speech doesn't spontaneously resolve, recommend Outpatient ST services.     SLP Assessment  SLP Recommendation/Assessment: All further Speech Lanaguage Pathology  needs can be addressed in the next venue of care SLP Visit Diagnosis: Cognitive communication deficit (R41.841)    Follow Up Recommendations  Outpatient SLP    Frequency and  Duration  N/A         SLP Evaluation Cognition  Overall Cognitive Status: Within Functional Limits for tasks assessed Arousal/Alertness: Awake/alert Orientation Level: Oriented X4       Comprehension  Auditory Comprehension Overall Auditory Comprehension: Appears within functional limits for tasks assessed Visual Recognition/Discrimination Discrimination: Within Function Limits Reading Comprehension Reading Status: Not tested    Expression Expression Primary Mode of Expression: Verbal Verbal Expression Overall Verbal Expression: Appears within functional limits for tasks assessed Written Expression Written Expression: Not tested   Oral / Motor  Oral Motor/Sensory Function Overall Oral Motor/Sensory Function: Mild impairment Facial ROM: Within Functional Limits Facial Symmetry: Within Functional Limits Facial Strength: Within Functional Limits Facial Sensation: Within Functional Limits Lingual ROM: Within Functional Limits Lingual Symmetry: Within Functional Limits Lingual Strength: Reduced Lingual Sensation: Within Functional Limits Motor Speech Overall Motor Speech: Impaired Respiration: Within functional limits Phonation: Normal Resonance: Within functional limits Articulation: Impaired Level of Impairment: Conversation Intelligibility: Intelligibility reduced Word: 75-100% accurate Phrase: 75-100% accurate Sentence: 75-100% accurate Conversation: 75-100% accurate(~ 90%) Motor Planning: Witnin functional limits Motor Speech Errors: Not applicable Effective Techniques: Over-articulate   GO            Tanya Aguirre M.S., CCC-SLP, [facility] Speech-Language Pathologist Rehabilitation Services Office [phone]         Reuel Derby 11/30/2019, 1:44 PM

## 2019-11-30 NOTE — Evaluation (Signed)
Occupational Therapy Evaluation Patient Details Name: Tanya Aguirre MRN: [id number] DOB: 1941/02/08 Today's Date: 11/30/2019    History of Present Illness presented to ER secondary to acute onset of dizziness, nausea/vomiting, abdominal pain; admitted for TIA/CVA work up.  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   Clinical Impression   Pt seen for OT evaluation this date. Prior to hospital admission, pt was independent in all aspects of ADL/IADL without AD and no falls in past 12 months. Pt lives with her spouse and supportive family nearby in a 1 story home with basement and walk-in shower with built-in bench and comfort height toilet. Currently pt demonstrates impairments in R sided strength, coordination, balance, and mild expressive speech deficits impairing her ability to perform ADL mobility and ADL tasks at baseline independence. Pt's family report improvements overall since admission but not at baseline. Pt able to perform ADL transfers from recliner with CGA and using RW for stability with dynamic standing and ambulation. Able to perform seated LB dressing with increased effort (R side more effort than L side). Pt denies current visual, cognitive, and sensory deficits. Pt and family instructed in [facility] exercises and ADL/IADL opportunities for therapeutic intervention for RUE to improve her return to baseline independence. Pt/family also educated in compensatory strategies for medication mgt. (See below for detail). Pt/family verbalizes understanding but would benefit from additional skilled OT services to maximize recall and carryover of learned strategies as well as to maximize return to PLOF/independence, minimize falls risk, and minimize caregiver burden. Recommend high-intensity skilled OT services at [facility] upon discharge. Will continue to assess in future sessions for most appropriate discharge recommendation.    Follow Up Recommendations  CIR     Equipment Recommendations  None recommended by OT    Recommendations for Other Services       Precautions / Restrictions Precautions Precautions: Fall Restrictions Weight Bearing Restrictions: No      Mobility Bed Mobility Overal bed mobility: Modified Independent             General bed mobility comments: deferred, in recliner  Transfers Overall transfer level: Needs assistance   Transfers: Sit to/from Stand Sit to Stand: Min guard         General transfer comment: increased effort to perform, unsteady during effort but no physical assist required    Balance Overall balance assessment: Needs assistance Sitting-balance support: No upper extremity supported;Feet supported Sitting balance-Leahy Scale: Good     Standing balance support: No upper extremity supported Standing balance-Leahy Scale: Fair Standing balance comment: pt able to perform static standing without UE support and no LOB, unable to accept challenge, required BUE support on RW during ADL mobility                           ADL either performed or assessed with clinical judgement   ADL Overall ADL's : Needs assistance/impaired Eating/Feeding: Modified independent Eating/Feeding Details (indicate cue type and reason): mildly impaired [facility] but able to utilize dominant RUE to hold utensils, open packs/containers, and prepare ice tea with splenda without assist or significant spillage Grooming: Sitting;Modified independent Grooming Details (indicate cue type and reason): mildly impaired [facility] Upper Body Bathing: Sitting;Supervision/ safety;Set up   Lower Body Bathing: Sit to/from stand;Min guard;Minimal assistance   Upper Body Dressing : Sitting;Set up;Supervision/safety   Lower Body Dressing: Sit to/from stand;Minimal assistance;Min guard Lower Body Dressing Details (indicate cue type and reason): pt able to don/doff  sock from seated position without assist, mild FMC impairments and R  foot more difficult to don/doff than L per pt report Toilet Transfer: Ambulation;Min guard;BSC;RW           Functional mobility during ADLs: Min guard;Rolling walker General ADL Comments: unable to perform ADL mobility without AD     Vision Baseline Vision/History: Wears glasses Wears Glasses: Reading only Patient Visual Report: Blurring of vision Vision Assessment?: No apparent visual deficits Additional Comments: Pt endorses initial blurry vision just prior to admission but denies deficits now     Perception     Praxis      Pertinent Vitals/Pain Pain Assessment: No/denies pain     Hand Dominance Right   Extremity/Trunk Assessment Upper Extremity Assessment Upper Extremity Assessment: (strength grossly 4+/5; mild/mod ataxia R UE)   Lower Extremity Assessment Lower Extremity Assessment: Defer to PT evaluation(R LE grossly 4+/5, L LE grossly 4-/5; denies sensory deficit; mild ataxia bilat LEs)       Communication Communication Communication: Other (comment)(mildly slurred, dysarthric; family agrees but reports improving since admission)   Cognition Arousal/Alertness: Awake/alert Behavior During Therapy: WFL for tasks assessed/performed Overall Cognitive Status: Within Functional Limits for tasks assessed                                     General Comments       Exercises Other Exercises Other Exercises: 5x sit/stand without assist device, 36 seconds, increased compared to age-matched norms and indicative of increased fall risk.  Increased sway in A/P plane; decreased eccentric control with stand to sit Other Exercises: Toilet transfer, ambulatory with RW, min assist; min cuing for safety, walker position and management.  Quick to release RW with tasks, often attempting reach outside BOS (requiring UE support and +1 assist for stabilization_ Other Exercises: 65' with RW, min assist-improved core activation and overall stability with use of assist  device; continues with higher level balance deficits, slow and broad turning radius.  Do recommend continued use of RW and +1 at this time. Other Exercises: Pt/family educated in [facility] ex and daily ADL and IADL tasks to perform incorporating RUE into task and encouraging BUE involvement to support gains in function Other Exercises: Pt/family educated in cognitive compensatory strategies for medication mgt as pt endorses very good with Afib medication compliance but not with Aspirin which she was told to take per MD   Shoulder Instructions      Home Living Family/patient expects to be discharged to:: Private residence Living Arrangements: Spouse/significant other Available Help at Discharge: Family;Available 24 hours/day;Available PRN/intermittently Type of Home: House Home Access: Stairs to enter [employer] of Steps: 2 steps without rails to front entrance; full flight from basement, R ascending rail   Home Layout: Two level;Laundry or work area in basement;Full bath on main level     Bathroom Shower/Tub: Producer, television/film/video: Handicapped height     Home Equipment: None;Shower seat - built in;Hand held Public house manager      Lives With: Spouse    Prior Functioning/Environment Level of Independence: Independent        Comments: Indep, no falls        OT Problem List: Decreased coordination;Decreased strength;Cardiopulmonary status limiting activity;Impaired balance (sitting and/or standing);Decreased knowledge of use of DME or AE;Impaired UE functional use      OT Treatment/Interventions: Self-care/ADL training;Therapeutic exercise;Therapeutic activities;Neuromuscular education;DME and/or AE instruction;Patient/family education;Balance training  OT Goals(Current goals can be found in the care plan section) Acute Rehab OT Goals Patient Stated Goal: to return home when able OT Goal Formulation: With patient/family Time For Goal Achievement:  12/14/19 Potential to Achieve Goals: Good ADL Goals Pt Will Perform Lower Body Dressing: with modified independence;sit to/from stand Pt Will Transfer to Toilet: with modified independence;ambulating(LRAD as needed, comfort height toilet) Pt/caregiver will Perform Home Exercise Program: Right Upper extremity;Independently;With written HEP provided(FMC)  OT Frequency: Min 3X/week   Barriers to D/C:            Co-evaluation              AM-PAC OT "6 Clicks" Daily Activity     Outcome Measure Help from another person eating meals?: None Help from another person taking care of personal grooming?: None Help from another person toileting, which includes using toliet, bedpan, or urinal?: A Little Help from another person bathing (including washing, rinsing, drying)?: A Little Help from another person to put on and taking off regular upper body clothing?: None Help from another person to put on and taking off regular lower body clothing?: A Little 6 Click Score: 21   End of Session Equipment Utilized During Treatment: Gait belt;Rolling walker  Activity Tolerance: Patient tolerated treatment well Patient left: in chair;with call bell/phone within reach;with chair alarm set;with family/visitor present  OT Visit Diagnosis: Other abnormalities of gait and mobility (R26.89);Hemiplegia and hemiparesis;Ataxia, unspecified (R27.0) Hemiplegia - Right/Left: Right Hemiplegia - dominant/non-dominant: Dominant Hemiplegia - caused by: Cerebral infarction                Time: 9381-8299 OT Time Calculation (min): 48 min Charges:  OT General Charges $OT Visit: 1 Visit OT Evaluation $OT Eval Moderate Complexity: 1 Mod OT Treatments $Self Care/Home Management : 8-22 mins $Neuromuscular Re-education: 8-22 mins  Jeni Salles, MPH, MS, OTR/L ascom [phone] 11/30/19, 6:00 PM

## 2019-12-01 DIAGNOSIS — G8194 Hemiplegia, unspecified affecting left nondominant side: Secondary | ICD-10-CM | POA: Diagnosis present

## 2019-12-01 DIAGNOSIS — I493 Ventricular premature depolarization: Secondary | ICD-10-CM | POA: Diagnosis present

## 2019-12-01 DIAGNOSIS — R29700 NIHSS score 0: Secondary | ICD-10-CM | POA: Diagnosis present

## 2019-12-01 DIAGNOSIS — R109 Unspecified abdominal pain: Secondary | ICD-10-CM | POA: Diagnosis present

## 2019-12-01 DIAGNOSIS — I10 Essential (primary) hypertension: Secondary | ICD-10-CM | POA: Diagnosis present

## 2019-12-01 DIAGNOSIS — I63343 Cerebral infarction due to thrombosis of bilateral cerebellar arteries: Secondary | ICD-10-CM | POA: Diagnosis present

## 2019-12-01 DIAGNOSIS — Z8249 Family history of ischemic heart disease and other diseases of the circulatory system: Secondary | ICD-10-CM | POA: Diagnosis not present

## 2019-12-01 DIAGNOSIS — E119 Type 2 diabetes mellitus without complications: Secondary | ICD-10-CM | POA: Diagnosis present

## 2019-12-01 DIAGNOSIS — Z7984 Long term (current) use of oral hypoglycemic drugs: Secondary | ICD-10-CM | POA: Diagnosis not present

## 2019-12-01 DIAGNOSIS — I639 Cerebral infarction, unspecified: Secondary | ICD-10-CM | POA: Diagnosis present

## 2019-12-01 DIAGNOSIS — R112 Nausea with vomiting, unspecified: Secondary | ICD-10-CM | POA: Diagnosis present

## 2019-12-01 DIAGNOSIS — H5500 Unspecified nystagmus: Secondary | ICD-10-CM | POA: Diagnosis present

## 2019-12-01 DIAGNOSIS — Z833 Family history of diabetes mellitus: Secondary | ICD-10-CM | POA: Diagnosis not present

## 2019-12-01 DIAGNOSIS — E785 Hyperlipidemia, unspecified: Secondary | ICD-10-CM | POA: Diagnosis present

## 2019-12-01 DIAGNOSIS — R918 Other nonspecific abnormal finding of lung field: Secondary | ICD-10-CM | POA: Diagnosis present

## 2019-12-01 DIAGNOSIS — I4891 Unspecified atrial fibrillation: Secondary | ICD-10-CM | POA: Diagnosis present

## 2019-12-01 DIAGNOSIS — Z20822 Contact with and (suspected) exposure to covid-19: Secondary | ICD-10-CM | POA: Diagnosis present

## 2019-12-01 DIAGNOSIS — R4781 Slurred speech: Secondary | ICD-10-CM | POA: Diagnosis present

## 2019-12-01 DIAGNOSIS — Z794 Long term (current) use of insulin: Secondary | ICD-10-CM | POA: Diagnosis not present

## 2019-12-01 LAB — GLUCOSE, CAPILLARY
Glucose-Capillary: 115 mg/dL — ABNORMAL HIGH (ref 70–99)
Glucose-Capillary: 138 mg/dL — ABNORMAL HIGH (ref 70–99)
Glucose-Capillary: 145 mg/dL — ABNORMAL HIGH (ref 70–99)
Glucose-Capillary: 161 mg/dL — ABNORMAL HIGH (ref 70–99)
Glucose-Capillary: 226 mg/dL — ABNORMAL HIGH (ref 70–99)
Glucose-Capillary: 250 mg/dL — ABNORMAL HIGH (ref 70–99)

## 2019-12-01 LAB — CBC WITH DIFFERENTIAL/PLATELET
Abs Immature Granulocytes: 0.03 10*3/uL (ref 0.00–0.07)
Basophils Absolute: 0.1 10*3/uL (ref 0.0–0.1)
Basophils Relative: 1 %
Eosinophils Absolute: 0.1 10*3/uL (ref 0.0–0.5)
Eosinophils Relative: 2 %
HCT: 35.4 % — ABNORMAL LOW (ref 36.0–46.0)
Hemoglobin: 11.9 g/dL — ABNORMAL LOW (ref 12.0–15.0)
Immature Granulocytes: 0 %
Lymphocytes Relative: 25 %
Lymphs Abs: 1.9 10*3/uL (ref 0.7–4.0)
MCH: 29.9 pg (ref 26.0–34.0)
MCHC: 33.6 g/dL (ref 30.0–36.0)
MCV: 88.9 fL (ref 80.0–100.0)
Monocytes Absolute: 0.9 10*3/uL (ref 0.1–1.0)
Monocytes Relative: 12 %
Neutro Abs: 4.5 10*3/uL (ref 1.7–7.7)
Neutrophils Relative %: 60 %
Platelets: 227 10*3/uL (ref 150–400)
RBC: 3.98 MIL/uL (ref 3.87–5.11)
RDW: 13.5 % (ref 11.5–15.5)
WBC: 7.6 10*3/uL (ref 4.0–10.5)
nRBC: 0 % (ref 0.0–0.2)

## 2019-12-01 LAB — BASIC METABOLIC PANEL
Anion gap: 11 (ref 5–15)
BUN: 19 mg/dL (ref 8–23)
CO2: 27 mmol/L (ref 22–32)
Calcium: 8.9 mg/dL (ref 8.9–10.3)
Chloride: 101 mmol/L (ref 98–111)
Creatinine, Ser: 1.34 mg/dL — ABNORMAL HIGH (ref 0.44–1.00)
GFR calc Af Amer: 44 mL/min — ABNORMAL LOW (ref >60)
GFR calc non Af Amer: 38 mL/min — ABNORMAL LOW (ref >60)
Glucose, Bld: 133 mg/dL — ABNORMAL HIGH (ref 70–99)
Potassium: 3.3 mmol/L — ABNORMAL LOW (ref 3.5–5.1)
Sodium: 139 mmol/L (ref 135–145)

## 2019-12-01 LAB — HEMOGLOBIN A1C
Hgb A1c MFr Bld: 6.9 % — ABNORMAL HIGH (ref 4.8–5.6)
Mean Plasma Glucose: 151 mg/dL

## 2019-12-01 LAB — MAGNESIUM: Magnesium: 2 mg/dL (ref 1.7–2.4)

## 2019-12-01 NOTE — Progress Notes (Addendum)
CCMD called and reported that pt just have a 2.50 second paused and HR went 25 then up to 47. Pt asymptomatic. VSS. HR at this time is at 55 Notify NP Jon Billings. Will continue to monitor.  Update 2308: talked to Chelsea and ordered to notify CCMD and do a QTC once. CCMD called and reported a QTC of 0.36 and BMP once. Will continue to monitor.  Update 0044: potassium resulted at 3.1 notify [facility] NP. Will continue to monitor.

## 2019-12-01 NOTE — TOC Initial Note (Signed)
Transition of Care [facility]) - Initial/Assessment Note    Patient Details  Name: Tanya Aguirre MRN: [id number] Date of Birth: 01/20/1941  Transition of Care [facility]) CM/SW Contact:    Eileen Stanford, LCSW Phone Number: 12/01/2019, 10:11 AM  Clinical Narrative:  CSW spoke with pt at bedside. CSW explained that CIR will not take pt due to pt's observation status. CSW explained the alternative options; home with home health or SNF. Pt lives with her husband. Pt states her husband can assist in her care however is not home 24 hours of the day. Pt states she is not as stable on her feet as she wished. Pt states she is interested in SNF however would like a hour to think about it. Pt states if she goes to SNF she would like to go to [facility].  Pt states CSW can send referral to [facility] in the meantime. CSW will start auth if pt is agreeable.           Expected Discharge Plan: Skilled Nursing Facility Barriers to Discharge: Continued Medical Work up, Ship broker   Patient Goals and CMS Choice Patient states their goals for this hospitalization and ongoing recovery are:: to get better   Choice offered to / list presented to : Patient  Expected Discharge Plan and Services Expected Discharge Plan: [place] In-house Referral: NA   Post Acute Care Choice: [place] Living arrangements for the past 2 months: Single Family Home                                      Prior Living Arrangements/Services Living arrangements for the past 2 months: Single Family Home Lives with:: Spouse Patient language and need for interpreter reviewed:: Yes Do you feel safe going back to the place where you live?: Yes      Need for Family Participation in Patient Care: Yes (Comment) Care giver support system in place?: Yes (comment)   Criminal Activity/Legal Involvement Pertinent to Current Situation/Hospitalization: No - Comment as needed  Activities of Daily  Living Home Assistive Devices/Equipment: None ADL Screening (condition at time of admission) Patient's cognitive ability adequate to safely complete daily activities?: Yes Is the patient deaf or have difficulty hearing?: No Does the patient have difficulty seeing, even when wearing glasses/contacts?: No Does the patient have difficulty concentrating, remembering, or making decisions?: No Patient able to express need for assistance with ADLs?: Yes Does the patient have difficulty dressing or bathing?: No Independently performs ADLs?: Yes (appropriate for developmental age) Does the patient have difficulty walking or climbing stairs?: No Weakness of Legs: Left Weakness of Arms/Hands: Left  Permission Sought/Granted Permission sought to share information with : Family Supports    Share Information with NAME: Audry Pili     Permission granted to share info w Relationship: son     Emotional Assessment Appearance:: Appears stated age Attitude/Demeanor/Rapport: Engaged Affect (typically observed): Accepting, Appropriate Orientation: : Oriented to Self, Oriented to Place, Oriented to  Time, Oriented to Situation Alcohol / Substance Use: Not Applicable Psych Involvement: No (comment)  Admission diagnosis:  Stroke [facility]) [I63.9] 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 A-fib ([place])   . Diabetes mellitus without complication ([place])   . Hypertension   . Stroke ([place])   . Abdominal pain   .  HLD (hyperlipidemia)    PCP:  Alan Mulder, MD Pharmacy:   [id number] - [place], [place] - [address] [place] [address] Phone: [phone] Fax: [phone]     Social Determinants of Health (SDOH) Interventions    Readmission Risk Interventions No flowsheet data found.

## 2019-12-01 NOTE — Progress Notes (Signed)
PROGRESS NOTE    Tanya Aguirre  QJF:[id number] DOB: 12/16/1940 DOA: 11/29/2019 PCP: Alan Mulder, MD   Brief Narrative:  HPI: Tanya Aguirre is a 79 y.o. female with medical history significant of hypertension, hyperlipidemia, diabetes mellitus, atrial fibrillation not on anticoagulants, who presents with dizziness, nausea vomiting and abdominal pain.  Per her daughter-in-law, patient started having dizziness and a difficulty walking at about 10 PM last night.  She also has some mild slurred speech, but no vision loss or hearing loss.  Patient does not have unilateral numbness or weakness in extremities.  Patient has headache.  She also reports nausea, vomiting and abdominal pain.  She has vomited several times with nonbloody nonbilious vomitus.  Her abdominal pain is located in the central abdomen, constant, aching, moderate, nonradiating.  No diarrhea.  No fever or chills.  No symptoms of UTI.  Patient does not have chest pain, shortness breath, cough  5/21: Patient seen and examined.  Spouse and daughter at bedside.  Patient reports feeling better.  MRI brain shows moderate sized infarct.  Follow-up by neurology appreciated.  PT and OT consults pending.  5/22: Patient seen and examined.  No family members at bedside this morning.  Patient feels well.  Still with some left-sided weakness.  No bleeding noted on CTA.  Plan to start therapeutic anticoagulation on day 5 post stroke.  No reason for dual antiplatelet therapy per neurology.   Assessment & Plan:   Principal Problem:   Stroke [facility]) Active Problems:   A-fib (HCC)   Diabetes mellitus without complication (HCC)   Hypertension   Abdominal pain   HLD (hyperlipidemia)  Acute CVA  Ct-head showed possible right cerebellar infarct MRI confirms moderate size infarct Patient has known history of atrial fibrillation but was not on anticoagulation for unclear reasons .  Neurology, Dr. Loretha Brasil is consulted Plan: ASA daily.   Eliquis to be started on day 5 (today is day 3) PT and OT consults: Recommending CIR however insurance might be an issue of patient is observation status.  Discussed with TOC.  [facility] is [facility] possibly [facility]  Referral has been sent.  Authorization has been started.   A-fib (HCC)  pt is not on anticoagulants at home, unclear reason CHA2DS2-VASc is 7 Plan: -on ASA.  Plan to introduce Eliquis on 5/24 -Continue sotalol and Cardizem  Diabetes mellitus without complication (HCC) Most recent A1c not on record.  Patient is taking Metformin and glargine insulin 12-20 unit daily at home Plan: Glargine 12 units daily SSI Carb modified diet  Hypertension -Hold Lasix and amlodipine to allow permissive hypertension -As needed hydralazine for SBP>220 or dBP > 120 -pt is on Cardizem and sotalol for afib  Abdominal pain, resolved Patient has nausea vomiting and central abdominal pain.  Lipase 34.  Etiology is not clear. CT abd no etiology As needed Zofran and morphine  HLD (hyperlipidemia): zocor  Pulmonary nodules Small and multiple Noted on CT abd Redemonstrated on CT chest with contrast Case discussed with pulmonary consultants Dr.Aleskerov He reviewed imaging, agrees results are concerning He agreed to see patient in the office post discharge His recommendations and input are greatly appreciated   DVT prophylaxis: Lovenox Code Status: Full Family Communication: None today Disposition Plan:Status is: Observation  The patient remains OBS appropriate and will d/c before 2 midnights.  Dispo: The patient is from: Home              Anticipated d/c is to: SNF  Anticipated d/c date is: 1 day              Patient currently is medically stable to d/c.   Pending SNF authorization        Consultants:   Neurology  Procedures:   2D echo  Antimicrobials:   None   Subjective: Seen and examined. CT chest results reviewed with  patient.  No complaints.  Objective: Vitals:   12/01/19 0424 12/01/19 0600 12/01/19 0716 12/01/19 1059  BP: (!) 144/73 (!) 151/59 (!) 145/79 (!) 146/72  Pulse: 91  72 80  Resp: 19 12 (!) 22 19  Temp: 98.2 F (36.8 C)  97.9 F (36.6 C) (!) 97.5 F (36.4 C)  TempSrc: Oral  Oral Oral  SpO2: 95%  96% 98%  Weight:      Height:        Intake/Output Summary (Last 24 hours) at 12/01/2019 1231 Last data filed at 12/01/2019 1000 Gross per 24 hour  Intake 360 ml  Output 200 ml  Net 160 ml   Filed Weights   11/29/19 0908 11/29/19 2240 11/30/19 0423  Weight: 108.9 kg 107.6 kg 108.1 kg    Examination:  General exam: Appears calm and comfortable  Respiratory system: Clear to auscultation. Respiratory effort normal. Cardiovascular system: S1 & S2 heard, RRR. No JVD, murmurs, rubs, gallops or clicks. No pedal edema. Gastrointestinal system: Abdomen is nondistended, soft and nontender. No organomegaly or masses felt. Normal bowel sounds heard. Central nervous system: Alert and oriented. No focal neurological deficits. Extremities: Symmetric 5 x 5 power. Skin: No rashes, lesions or ulcers Psychiatry: Judgement and insight appear normal. Mood & affect appropriate.     Data Reviewed: I have personally reviewed following labs and imaging studies  CBC: Recent Labs  Lab 11/29/19 0923 12/01/19 0428  WBC 9.8 7.6  NEUTROABS  --  4.5  HGB 11.9* 11.9*  HCT 36.4 35.4*  MCV 90.8 88.9  PLT 230 227   Basic Metabolic Panel: Recent Labs  Lab 11/29/19 0923 11/30/19 1258 12/01/19 0428  NA 134* 138 139  K 3.8 3.4* 3.3*  CL 97* 100 101  CO2 GLUCOSE 274* 111* 133*  BUN CREATININE 1.04* 1.13* 1.34*  CALCIUM 9.0 8.9 8.9  MG  --   --  2.0   GFR: Estimated Creatinine Clearance: 42.3 mL/min (A) (by C-G formula based on SCr of 1.34 mg/dL (H)). Liver Function Tests: No results for input(s): AST, ALT, ALKPHOS, BILITOT, PROT, ALBUMIN in the last 168 hours. Recent Labs   Lab 11/29/19 0923  LIPASE 34   No results for input(s): AMMONIA in the last 168 hours. Coagulation Profile: No results for input(s): INR, PROTIME in the last 168 hours. Cardiac Enzymes: No results for input(s): CKTOTAL, CKMB, CKMBINDEX, TROPONINI in the last 168 hours. BNP (last 3 results) No results for input(s): PROBNP in the last 8760 hours. HbA1C: Recent Labs    11/30/19 0414  HGBA1C 6.9*   CBG: Recent Labs  Lab 11/30/19 1625 11/30/19 2003 11/30/19 2311 12/01/19 0717 12/01/19 1141  GLUCAP 166* 133* 111* 115* 161*   Lipid Profile: Recent Labs    11/30/19 0414  CHOL 142  HDL 61  LDLCALC 68  TRIG 67  CHOLHDL 2.3   Thyroid Function Tests: No results for input(s): TSH, T4TOTAL, FREET4, T3FREE, THYROIDAB in the last 72 hours. Anemia Panel: No results for input(s): VITAMINB12, FOLATE, FERRITIN, TIBC, IRON, RETICCTPCT in the last 72 hours. Sepsis Labs: No  results for input(s): PROCALCITON, LATICACIDVEN in the last 168 hours.  Recent Results (from the past 240 hour(s))  SARS Coronavirus 2 by RT PCR (hospital order, performed in [facility] hospital lab) Nasopharyngeal Nasopharyngeal Swab     Status: None   Collection Time: 11/29/19 12:07 PM   Specimen: Nasopharyngeal Swab  Result Value Ref Range Status   SARS Coronavirus 2 NEGATIVE NEGATIVE Final    Comment: (NOTE) SARS-CoV-2 target nucleic acids are NOT DETECTED. The SARS-CoV-2 RNA is generally detectable in upper and lower respiratory specimens during the acute phase of infection. The lowest concentration of SARS-CoV-2 viral copies this assay can detect is 250 copies / mL. A negative result does not preclude SARS-CoV-2 infection and should not be used as the sole basis for treatment or other patient management decisions.  A negative result may occur with improper specimen collection / handling, submission of specimen other than nasopharyngeal swab, presence of viral mutation(s) within the areas targeted by  this assay, and inadequate number of viral copies (<250 copies / mL). A negative result must be combined with clinical observations, patient history, and epidemiological information. Fact Sheet for Patients:   BoilerBrush.com.cy Fact Sheet for Healthcare Providers: https://pope.com/ This test is not yet approved or cleared  by the [place] FDA and has been authorized for detection and/or diagnosis of SARS-CoV-2 by FDA under an Emergency Use Authorization (EUA).  This EUA will remain in effect (meaning this test can be used) for the duration of the COVID-19 declaration under Section 564(b)(1) of the Act, 21 U.S.C. section 360bbb-3(b)(1), unless the authorization is terminated or revoked sooner. Performed at [facility], [address] [place], [place] [address]          Radiology Studies: CT ANGIO HEAD W OR WO CONTRAST  Result Date: 11/30/2019 CLINICAL DATA:  Stroke on MRI, multiple lung nodules EXAM: CT ANGIOGRAPHY HEAD TECHNIQUE: Multidetector CT imaging of the head was performed using the standard protocol during bolus administration of intravenous contrast. Multiplanar CT image reconstructions and MIPs were obtained to evaluate the vascular anatomy. CONTRAST:  OMNIPAQUE IOHEXOL 350 MG/ML SOLN COMPARISON:  None. FINDINGS: CT HEAD Brain: There is no acute intracranial hemorrhage. More well-defined hypoattenuation is present in the right cerebellar hemisphere consistent with evolving acute infarction. The small left cerebellar infarct is better seen on MRI. There remains no significant mass effect in the posterior fossa or hydrocephalus. Chronic inferior right frontal infarct. No new loss of gray-white differentiation. Vascular: There is intracranial atherosclerotic calcification at the skull base. Skull: Unremarkable. Sinuses: Visualized portions are aerated. Orbits: Visualized portions are unremarkable. CTA HEAD  Anterior circulation: Intracranial internal carotid arteries patent with calcified plaque causing mild stenosis. Anterior and middle cerebral arteries are patent. Posterior circulation: Intracranial vertebral arteries, basilar artery, and posterior cerebral arteries are patent. Right PICA origin is not well seen. AICA origins are patent. Right superior cerebellar artery origin not well seen and there appears to be diminished flow within this vessel. There are bilateral posterior communicating arteries. There is a 2 mm inferiorly directed outpouching at the right PCOM origin. The vessel may rise eccentrically rather than at the apex of this outpouching and aneurysm cannot be excluded. Venous sinuses: Not well evaluated on this study. IMPRESSION: Evolving acute right cerebellar infarct. Small left cerebellar infarct better seen on MRI. There remains no significant mass effect or hydrocephalus. No acute intracranial hemorrhage. Right PICA and SCA origins are not well seen. 2 mm infundibulum or aneurysm at the right posterior  communicating artery origin. Electronically Signed   By: Guadlupe SpanishPraneil  Patel M.D.   On: 11/30/2019 15:11   CT CHEST W CONTRAST  Result Date: 11/30/2019 CLINICAL DATA:  Nodular densities on recent CT abdomen examination EXAM: CT CHEST WITH CONTRAST TECHNIQUE: Multidetector CT imaging of the chest was performed during intravenous contrast administration. CONTRAST:  100mL OMNIPAQUE IOHEXOL 350 MG/ML SOLN COMPARISON:  100 mL Omnipaque 350. FINDINGS: Cardiovascular: Atherosclerotic calcifications of the thoracic aorta are noted. No aneurysmal dilatation or dissection is seen. Truncus anomaly is noted. Pulmonary artery as visualized is within normal limits. No significant coronary calcifications are noted. Heart is at the upper limits of normal in size. Mediastinum/Nodes: Thoracic inlet is within normal limits. No hilar or mediastinal adenopathy is noted. The esophagus is within normal limits.  Lungs/Pleura: Lungs are well aerated bilaterally. Multiple bilateral subcentimeter nodules are noted throughout both lungs again suspicious for metastatic disease. No dominant nodule is seen. No sizable effusion is noted. Upper Abdomen: Upper abdomen demonstrates cholelithiasis without complicating factors. Normal excretion of contrast is noted from the kidneys. No other focal abnormality is seen. Musculoskeletal: Degenerative changes of the thoracic spine are noted. No lytic or sclerotic bony lesion is seen. IMPRESSION: Multiple subcentimeter sub solid nodules are seen highly suspicious for metastatic disease. Tissue sampling may be helpful. Cholelithiasis without complicating factors. Aortic Atherosclerosis (ICD10-I70.0). Electronically Signed   By: Alcide CleverMark  Lukens M.D.   On: 11/30/2019 15:09   MR BRAIN WO CONTRAST  Result Date: 11/29/2019 CLINICAL DATA:  Abnormal CT EXAM: MRI HEAD WITHOUT CONTRAST TECHNIQUE: Multiplanar, multiecho pulse sequences of the brain and surrounding structures were obtained without intravenous contrast. COMPARISON:  Correlation made with CT earlier same day FINDINGS: Motion artifact is present. Brain: Moderate size acute infarction of the right cerebellar hemisphere. Subcentimeter acute infarct of the left cerebellum. There is no significant mass effect or hydrocephalus. Chronic inferior right frontal infarct. Punctate focus of susceptibility adjacent to the posterior body of the right lateral ventricle compatible with chronic microhemorrhage or less likely mineralization. Patchy T2 hyperintensity in the supratentorial white matter is nonspecific but may reflect mild chronic microvascular ischemic changes. Prominence of the ventricles and sulci reflects minor generalized parenchymal volume loss. There is no intracranial mass, mass effect, or edema. There is no hydrocephalus or extra-axial fluid collection. No abnormal enhancement. Vascular: Major vessel flow voids at the skull base are  preserved. Skull and upper cervical spine: Normal marrow signal is preserved. Sinuses/Orbits: Minor mucosal thickening.  Orbits are unremarkable. Other: Sella is unremarkable.  Mastoid air cells are clear. 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 Subcentimeter acute infarct of the left cerebellum. No significant mass effect or hydrocephalus. Chronic inferior right frontal infarct. Mild chronic microvascular ischemic changes. Electronically Signed   By: Guadlupe SpanishPraneil  Patel M.D.   On: 11/29/2019 15:20   CT ABDOMEN PELVIS W CONTRAST  Result Date: 11/29/2019 CLINICAL DATA:  Nausea and vomiting. EXAM: CT ABDOMEN AND PELVIS WITH CONTRAST TECHNIQUE: Multidetector CT imaging of the abdomen and pelvis was performed using the standard protocol following bolus administration of intravenous contrast. CONTRAST:  125mL OMNIPAQUE IOHEXOL 300 MG/ML  SOLN COMPARISON:  None. FINDINGS: Lower chest: Numerous ill-defined subcentimeter lung nodules are seen scattered throughout both lungs. Hepatobiliary: No focal liver abnormality is seen. Subcentimeter gallstones are seen within the gallbladder lumen without gallbladder wall thickening, or biliary dilatation. Pancreas: Unremarkable. No pancreatic ductal dilatation or surrounding inflammatory changes. Spleen: Normal in size without focal abnormality. Adrenals/Urinary Tract: Adrenal glands are unremarkable. Kidneys  are normal, without renal calculi, focal lesion, or hydronephrosis. Bladder is unremarkable. Stomach/Bowel: Stomach is within normal limits. Appendix appears normal. No evidence of bowel wall thickening, distention, or inflammatory changes. Noninflamed diverticula are seen throughout the sigmoid colon. Vascular/Lymphatic: There is moderate severity calcification of the abdominal aorta. No enlarged abdominal or pelvic lymph nodes. Reproductive: Multiple calcified fibroids of various sizes are seen within the uterus. An ill-defined 1.6 cm right adnexal  cyst is noted. Other: There is a 6.0 cm x 4.9 cm fat containing umbilical hernia. Musculoskeletal: A chronic appearing deformity is seen along the anterior aspect of the superior endplate of the L4 vertebral body. Moderate to marked severity degenerative changes are seen at the levels of L4-L5 and L5-S1. IMPRESSION: 1. Numerous ill-defined subcentimeter lung nodules scattered throughout both lungs worrisome for metastatic disease. 2. Cholelithiasis without evidence of acute cholecystitis. 3. 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 4. 6.0 cm x 4.9 cm fat containing umbilical hernia. 5. Chronic appearing deformity along the anterior aspect of the superior endplate of the L4 vertebral body. 6. Moderate to marked severity degenerative changes at the levels of L4-L5 and L5-S1. Aortic Atherosclerosis (ICD10-I70.0). Electronically Signed   By: Virgina Norfolk M.D.   On: 11/29/2019 18:40   US Carotid Bilateral (at [facility] and [facility] only)  Result Date: 11/29/2019 CLINICAL DATA:  79 year old female with a history of stroke symptoms EXAM: BILATERAL CAROTID DUPLEX ULTRASOUND TECHNIQUE: Pearline Cables scale imaging, color Doppler and duplex ultrasound were performed of bilateral carotid and vertebral arteries in the neck. COMPARISON:  None. FINDINGS: Criteria: Quantification of carotid stenosis is based on velocity parameters that correlate the residual internal carotid diameter with NASCET-based stenosis levels, using the diameter of the distal internal carotid lumen as the denominator for stenosis measurement. The following velocity measurements were obtained: RIGHT ICA:  Systolic 643 cm/sec, Diastolic 30 cm/sec CCA:  92 cm/sec SYSTOLIC ICA/CCA RATIO:  1.1 ECA:  83 cm/sec LEFT ICA:  Systolic 70 cm/sec, Diastolic 12 cm/sec CCA:  56 cm/sec SYSTOLIC ICA/CCA RATIO:  1.3 ECA:  114 cm/sec Right Brachial SBP: Not acquired Left Brachial SBP: Not acquired RIGHT CAROTID ARTERY: No significant calcifications of the right  common carotid artery. Intermediate waveform maintained. Heterogeneous and partially calcified plaque at the right carotid bifurcation. No significant lumen shadowing. Low resistance waveform of the right ICA. No significant tortuosity. RIGHT VERTEBRAL ARTERY: Antegrade flow with low resistance waveform. LEFT CAROTID ARTERY: No significant calcifications of the left common carotid artery. Intermediate waveform maintained. Heterogeneous and partially calcified plaque at the left carotid bifurcation without significant lumen shadowing. Low resistance waveform of the left ICA. No significant tortuosity. LEFT VERTEBRAL ARTERY:  Antegrade flow with low resistance waveform. IMPRESSION: Color duplex indicates minimal heterogeneous and calcified plaque, with no hemodynamically significant stenosis by duplex criteria in the extracranial cerebrovascular circulation. Signed, Dulcy Fanny. Dellia Nims, RPVI [facility] Electronically Signed   By: Corrie Mckusick D.O.   On: 11/29/2019 14:27   ECHOCARDIOGRAM COMPLETE  Result Date: 11/30/2019    ECHOCARDIOGRAM REPORT   Patient Name:   [facility] Date of Exam: 11/30/2019 Medical Rec #:  [id number]         Height:       65.0 in Accession #:    [id number]        Weight:       238.4 lb Date of Birth:  [birth year]          BSA:  2.131 m Patient Age:    79 years          BP:           139/53 mmHg Patient Gender: F                 HR:           68 bpm. Exam Location:  [facility] Procedure: 2D Echo, Color Doppler and Cardiac Doppler Indications:     I163.9 Stroke  History:         Patient has no prior history of Echocardiogram examinations.                  Arrythmias:Atrial Fibrillation; Risk Factors:Hypertension and                  Diabetes.  Sonographer:     Humphrey Rolls RDCS (AE) Referring Phys:  [id number] Brien Few NIU Diagnosing Phys: Julien Nordmann MD  Sonographer Comments: Suboptimal subcostal window. IMPRESSIONS  1. Left  ventricular ejection fraction, by estimation, is 60 to 65%. The left ventricle has normal function. The left ventricle has no regional wall motion abnormalities. Left ventricular diastolic parameters are indeterminate.  2. Right ventricular systolic function is normal. The right ventricular size is normal.  3. Left atrial size was mildly dilated.  4. The mitral valve is normal in structure. Mild mitral valve regurgitation.  5. Rhythm is atrial fibrillation FINDINGS  Left Ventricle: Left ventricular ejection fraction, by estimation, is 60 to 65%. The left ventricle has normal function. The left ventricle has no regional wall motion abnormalities. The left ventricular internal cavity size was normal in size. There is  no left ventricular hypertrophy. Left ventricular diastolic parameters are indeterminate. Right Ventricle: The right ventricular size is normal. No increase in right ventricular wall thickness. Right ventricular systolic function is normal. Left Atrium: Left atrial size was mildly dilated. Right Atrium: Right atrial size was normal in size. Pericardium: There is no evidence of pericardial effusion. Mitral Valve: The mitral valve is normal in structure. Normal mobility of the mitral valve leaflets. Mild mitral valve regurgitation. No evidence of mitral valve stenosis. MV peak gradient, 5.3 mmHg. The mean mitral valve gradient is 1.0 mmHg. Tricuspid Valve: The tricuspid valve is normal in structure. Tricuspid valve regurgitation is not demonstrated. No evidence of tricuspid stenosis. Aortic Valve: The aortic valve is normal in structure. Aortic valve regurgitation is not visualized. No aortic stenosis is present. Aortic valve mean gradient measures 2.0 mmHg. Aortic valve peak gradient measures 4.7 mmHg. Aortic valve area, by VTI measures 3.13 cm. Pulmonic Valve: The pulmonic valve was normal in structure. Pulmonic valve regurgitation is not visualized. No evidence of pulmonic stenosis. Aorta: The aortic  root is normal in size and structure. Venous: The inferior vena cava is normal in size with greater than 50% respiratory variability, suggesting right atrial pressure of 3 mmHg. IAS/Shunts: No atrial level shunt detected by color flow Doppler.  LEFT VENTRICLE PLAX 2D LVIDd:         4.35 cm  Diastology LVIDs:         2.39 cm  LV e' lateral:   11.50 cm/s LV PW:         0.92 cm  LV E/e' lateral: 9.6 LV IVS:        0.78 cm  LV e' medial:    8.05 cm/s LVOT diam:     2.20 cm  LV E/e' medial:  13.7 LV SV:  65 LV SV Index:   30 LVOT Area:     3.80 cm  LEFT ATRIUM             Index LA diam:        3.80 cm 1.78 cm/m LA Vol (A2C):   51.0 ml 23.93 ml/m LA Vol (A4C):   70.6 ml 33.12 ml/m LA Biplane Vol: 62.8 ml 29.46 ml/m  AORTIC VALVE                   PULMONIC VALVE AV Area (Vmax):    2.91 cm    PV Vmax:       0.73 m/s AV Area (Vmean):   3.12 cm    PV Vmean:      50.500 cm/s AV Area (VTI):     3.13 cm    PV VTI:        0.122 m AV Vmax:           108.00 cm/s PV Peak grad:  2.1 mmHg AV Vmean:          69.100 cm/s PV Mean grad:  1.0 mmHg AV VTI:            0.208 m AV Peak Grad:      4.7 mmHg AV Mean Grad:      2.0 mmHg LVOT Vmax:         82.80 cm/s LVOT Vmean:        56.700 cm/s LVOT VTI:          0.171 m LVOT/AV VTI ratio: 0.82  AORTA Ao Root diam: 2.70 cm MITRAL VALVE MV Area (PHT): 4.59 cm     SHUNTS MV Peak grad:  5.3 mmHg     Systemic VTI:  0.17 m MV Mean grad:  1.0 mmHg     Systemic Diam: 2.20 cm MV Vmax:       1.15 m/s MV Vmean:      51.2 cm/s MV Decel Time: 165 msec MV E velocity: 110.33 cm/s MV A velocity: 49.50 cm/s MV E/A ratio:  2.23 Julien Nordmann MD Electronically signed by Julien Nordmann MD Signature Date/Time: 11/30/2019/1:28:47 PM    Final         Scheduled Meds: . aspirin EC  325 mg Oral Daily  . diltiazem  120 mg Oral QHS  . enoxaparin (LOVENOX) injection  40 mg Subcutaneous Q24H  . insulin aspart  0-9 Units Subcutaneous Q4H  . insulin glargine  12 Units Subcutaneous Daily  .  lidocaine  1 patch Transdermal Q24H  . simvastatin  20 mg Oral QHS  . sotalol  80 mg Oral QHS   Continuous Infusions:   LOS: 0 days    Time spent: 35 minutes    Tresa Moore, MD [facility] Pager [phone]  If 7PM-7AM, please contact night-coverage 12/01/2019, 12:31 PM

## 2019-12-01 NOTE — Plan of Care (Signed)
  Problem: Education: Goal: Knowledge of General Education information will improve Description: Including pain rating scale, medication(s)/side effects and non-pharmacologic comfort measures Outcome: Progressing   Problem: Clinical Measurements: Goal: Cardiovascular complication will be avoided Outcome: Progressing   Problem: Activity: Goal: Risk for activity intolerance will decrease Outcome: Progressing   Problem: Safety: Goal: Ability to remain free from injury will improve Outcome: Progressing   

## 2019-12-01 NOTE — Progress Notes (Signed)
Occupational Therapy Treatment Patient Details Name: Tanya Aguirre MRN: [id number] DOB: May 20, 1941 Today's Date: 12/01/2019    History of present illness presented to ER secondary to acute onset of dizziness, nausea/vomiting, abdominal pain; admitted for TIA/CVA work up.  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   OT comments  Pt seen for OT tx this date to f/u re: [facility] and safety with ADL/ADL mobility. Pt demonstrating improved balance and general function on R side this date. OT engages pt in seated [facility] tasks with R hand to improve manipulation as it pertains to ADLs and pt demos progress with opposition, abd/add and flex/ext of digits of R hand, only struggling with in hand manipulation such as translation of a pen. Pt demos progress with ADL transfers and fxl mobility as well. SBA provided with RW for ADL transfers, pt ultimately only requiring MOD I for sit to stand, but one verbal cue to control descent for stand to sit. Pt completes ~32ft around her unit with RW to simulate fxl household distances, and does so without physical assistance at MOD I with only 1 verbal cue to pace pivoting d/t R LE with slightly slowed response time/slight reduced step clearance. Pt demos good safety awareness and pace. Pt requires setup and extended time with some aspects of ADLs d/t impacts on FMC, slightly impaired balance with shallow R foot clearance during fxl mobility, and generally decreased strength/fxl activity tolerance, pt could still benefit from f/u with therapy in setting of rehab. However, will continue to assess pt progress and potential.    Follow Up Recommendations  SNF    Equipment Recommendations  3 in 1 bedside commode;Other (comment)(2WW)    Recommendations for Other Services      Precautions / Restrictions Precautions Precautions: Fall Restrictions Weight Bearing Restrictions: No       Mobility Bed Mobility Overal bed mobility: Modified  Independent                Transfers Overall transfer level: Modified independent Equipment used: Rolling walker (2 wheeled) Transfers: Sit to/from Stand Sit to Stand: Modified independent (Device/Increase time)         General transfer comment: SBA provided d/t nature of OT treatment, but pt demos good control for completing transfers with RW. OT only provides one cue to control descent upon stand to sit.    Balance Overall balance assessment: Needs assistance Sitting-balance support: No upper extremity supported;Feet supported Sitting balance-Leahy Scale: Good     Standing balance support: Bilateral upper extremity supported Standing balance-Leahy Scale: Good Standing balance comment: demos G standing balance for fxl mobility and ADL transfers with RW with B UE support.                           ADL either performed or assessed with clinical judgement   ADL Overall ADL's : Needs assistance/impaired                     Lower Body Dressing: Set up;Sit to/from stand Lower Body Dressing Details (indicate cue type and reason): slightly extended tiem to don socks r/t decreased FMC of R hand, but otherwise completes herself. Sit to stand with RW with SBA to simulate clothing mgt over hips with no physical assist.             Functional mobility during ADLs: Supervision/safety;Rolling walker(compeltes ~50' with RW with G balance, some delayed swing through of R  LE, but otherwise safe and stable.)       Vision Baseline Vision/History: Wears glasses Wears Glasses: Reading only Patient Visual Report: Blurring of vision Vision Assessment?: No apparent visual deficits   Perception     Praxis      Cognition Arousal/Alertness: Awake/alert Behavior During Therapy: WFL for tasks assessed/performed Overall Cognitive Status: Within Functional Limits for tasks assessed                                          Exercises Other  Exercises Other Exercises: Pt demos good safety awareness with fxl mobility around her unit with RW, requiring only one cue to pace herself with pivoting d/t slightly decreased proprioception of R foot relative to L foot. Pt otherwise demos good control and stability. No LOB occurred and no physical assist from OT. Other Exercises: OT engages pt in 1 set x10 reps open and close hands, opposing each digit, abd/add of digits, and manipulating a pen-in hand translation with R hand x10 reps. Pt noted to have improved FMC of R hand this date, very slightly behind L hand on in-hand manipulation.   Shoulder Instructions       General Comments      Pertinent Vitals/ Pain       Pain Assessment: No/denies pain  Home Living                                          Prior Functioning/Environment              Frequency  Min 3X/week        Progress Toward Goals  OT Goals(current goals can now be found in the care plan section)  Progress towards OT goals: Progressing toward goals  Acute Rehab OT Goals Patient Stated Goal: to return home when able OT Goal Formulation: With patient/family Time For Goal Achievement: 12/14/19 Potential to Achieve Goals: Good  Plan Discharge plan needs to be updated;Frequency remains appropriate    Co-evaluation                 AM-PAC OT "6 Clicks" Daily Activity     Outcome Measure   Help from another person eating meals?: None Help from another person taking care of personal grooming?: None Help from another person toileting, which includes using toliet, bedpan, or urinal?: A Little Help from another person bathing (including washing, rinsing, drying)?: A Little Help from another person to put on and taking off regular upper body clothing?: None Help from another person to put on and taking off regular lower body clothing?: A Little 6 Click Score: 21    End of Session Equipment Utilized During Treatment: Gait belt;Rolling  walker  OT Visit Diagnosis: Other abnormalities of gait and mobility (R26.89);Hemiplegia and hemiparesis;Ataxia, unspecified (R27.0) Hemiplegia - Right/Left: Right Hemiplegia - dominant/non-dominant: Dominant Hemiplegia - caused by: Cerebral infarction   Activity Tolerance     Patient Left Other (comment)(seated EOB with 3 family members present.)   Nurse Communication Mobility status;Other (comment)(notified RN that pt wanting to remain sitting up EOB, ensuring pt with G balance. RN okay'ed)        Time: 1600-1630 OT Time Calculation (min): 30 min  Charges: OT Treatments $Therapeutic Activity: 8-22 mins $Neuromuscular Re-education: 8-22 mins  Rejeana Brock, MS, OTR/L  ascom [phone] 12/01/19, 5:10 PM

## 2019-12-01 NOTE — Progress Notes (Signed)
Physical Therapy Treatment Patient Details Name: Tanya Aguirre MRN: [id number] DOB: 12-09-1940 Today's Date: 12/01/2019    History of Present Illness presented to ER secondary to acute onset of dizziness, nausea/vomiting, abdominal pain; admitted for TIA/CVA work up.  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    PT Comments    Pt was long sitting in bed upon arriving. She is very pleasant and motivated to participate in PT. Reports less weakness with improved mobility. She was able to exit R side of bed without assistance. Stood to [employer] without assist and was able to ambulate 120 ft without LOB or unsteadiness. Performed ascending/descending step with use of RW to simulate home entry. She required CGA + vcs for proper sequencing. Pt is unable to go CIR due to observation status. Pt demonstrates improvements from previous date. Therapist feels pt would benefit from SNF if were to DC today however if continues to improve like she has over past 24 hours, may be able to DC to home with HHPT to follow. CM aware of pt's progress. Acute PT will treat pt next date and continue to progress pt as able per POC. She was seated in recliner with BLEs elevated, chair alarm in place, and call bell in reach at conclusion of PT session.     Follow Up Recommendations  SNF     Equipment Recommendations  Rolling walker with 5" wheels;3in1 (PT)    Recommendations for Other Services       Precautions / Restrictions Precautions Precautions: Fall Restrictions Weight Bearing Restrictions: No    Mobility  Bed Mobility Overal bed mobility: Modified Independent                Transfers Overall transfer level: Modified independent Equipment used: Rolling walker (2 wheeled) Transfers: Sit to/from Stand Sit to Stand: Modified independent (Device/Increase time)            Ambulation/Gait Ambulation/Gait assistance: Supervision Gait Distance (Feet): 120  Feet Assistive device: Rolling walker (2 wheeled) Gait Pattern/deviations: Step-through pattern     General Gait Details: pt demonstrated safe steady gait kinematics with use of AD.    Stairs Stairs: Yes Stairs assistance: Min guard Stair Management: No rails Number of Stairs: 1 General stair comments: pt was able to ascend/descend 1 step with use of RW. she demonstarted safe ability to perform. pt unsure if she wants to DC to home versus DC to SNF. unable to go to CIR due to observation status.   Wheelchair Mobility    Modified Rankin (Stroke Patients Only)       Balance Overall balance assessment: Needs assistance Sitting-balance support: No upper extremity supported;Feet supported Sitting balance-Leahy Scale: Good     Standing balance support: Bilateral upper extremity supported Standing balance-Leahy Scale: Good Standing balance comment: pt has good balance with BUE support but fair previous date without use of AD                            Cognition Arousal/Alertness: Awake/alert Behavior During Therapy: WFL for tasks assessed/performed Overall Cognitive Status: Within Functional Limits for tasks assessed                                 General Comments: A and O x 4 and cooperative and pleasant      Exercises      General Comments  Pertinent Vitals/Pain Pain Assessment: No/denies pain    Home Living                      Prior Function            PT Goals (current goals can now be found in the care plan section) Acute Rehab PT Goals Patient Stated Goal: to return home when able Progress towards PT goals: Progressing toward goals    Frequency    7X/week      PT Plan Discharge plan needs to be updated    Co-evaluation              AM-PAC PT "6 Clicks" Mobility   Outcome Measure  Help needed turning from your back to your side while in a flat bed without using bedrails?: None Help needed  moving from lying on your back to sitting on the side of a flat bed without using bedrails?: None Help needed moving to and from a bed to a chair (including a wheelchair)?: A Little Help needed standing up from a chair using your arms (e.g., wheelchair or bedside chair)?: A Little Help needed to walk in hospital room?: A Little Help needed climbing 3-5 steps with a railing? : A Little 6 Click Score: 20    End of Session Equipment Utilized During Treatment: Gait belt Activity Tolerance: Patient tolerated treatment well Patient left: in chair;with call bell/phone within reach;with chair alarm set Nurse Communication: Mobility status PT Visit Diagnosis: Muscle weakness (generalized) (M62.81);Difficulty in walking, not elsewhere classified (R26.2);Hemiplegia and hemiparesis Hemiplegia - Right/Left: Right Hemiplegia - dominant/non-dominant: Dominant Hemiplegia - caused by: Cerebral infarction     Time: 2094-7096 PT Time Calculation (min) (ACUTE ONLY): 26 min  Charges:  $Gait Training: 23-37 mins                     Jetta Lout PTA 12/01/19, 1:54 PM

## 2019-12-01 NOTE — NC FL2 (Signed)
[place] LEVEL OF CARE SCREENING TOOL     IDENTIFICATION  Patient Name: Tanya Aguirre Birthdate: [birth year] Sex: female Admission Date (Current Location): 11/29/2019  [place] and [place] Number:  Engineering geologist and Address:  [facility], [address], [place], [place] [address]      Provider Number: [id number]  Attending Physician Name and Address:  Sidney Ace, MD  Relative Name and Phone Number:       Current Level of Care: Hospital Recommended Level of Care: [place] Prior Approval Number:    Date Approved/Denied:   PASRR Number: [id number]  Discharge Plan: SNF    Current Diagnoses: Patient Active Problem List   Diagnosis Date Noted  . A-fib ([place])   . Diabetes mellitus without complication ([place])   . Hypertension   . Stroke ([place])   . Abdominal pain   . HLD (hyperlipidemia)     Orientation RESPIRATION BLADDER Height & Weight     Self, Time, Situation, Place  Normal Continent Weight: 238 lb 6.4 oz (108.1 kg) Height:  5\' 5"  (165.1 cm)  BEHAVIORAL SYMPTOMS/MOOD NEUROLOGICAL BOWEL NUTRITION STATUS      Continent Diet(heart healthy/carb modified)  AMBULATORY STATUS COMMUNICATION OF NEEDS Skin   Limited Assist Verbally Normal                       Personal Care Assistance Level of Assistance  Dressing, Feeding, Bathing Bathing Assistance: Limited assistance Feeding assistance: Independent Dressing Assistance: Limited assistance     Functional Limitations Info  Sight, Speech, Hearing Sight Info: Adequate Hearing Info: Adequate Speech Info: Adequate    SPECIAL CARE FACTORS FREQUENCY  PT (By licensed PT), OT (By licensed OT)     PT Frequency: 5x OT Frequency: 5x            Contractures Contractures Info: Not present    Additional Factors Info  Code Status, Allergies Code Status Info: Full Code Allergies Info: No known allergies           Current Medications  (12/01/2019):  This is the current hospital active medication list Current Facility-Administered Medications  Medication Dose Route Frequency Provider Last Rate Last Admin  . acetaminophen (TYLENOL) tablet 650 mg  650 mg Oral Q6H PRN Oswald Hillock, RPH   650 mg at 11/30/19 2328  . aspirin EC tablet 325 mg  325 mg Oral Daily Ivor Costa, MD   325 mg at 12/01/19 2595  . diltiazem (CARDIZEM) tablet 120 mg  120 mg Oral QHS Ivor Costa, MD   120 mg at 11/30/19 2020  . enoxaparin (LOVENOX) injection 40 mg  40 mg Subcutaneous Q24H Ivor Costa, MD   40 mg at 11/30/19 1357  . hydrALAZINE (APRESOLINE) injection 5 mg  5 mg Intravenous Q2H PRN Ivor Costa, MD      . insulin aspart (novoLOG) injection 0-9 Units  0-9 Units Subcutaneous Q4H Ivor Costa, MD   1 Units at 11/30/19 2021  . insulin glargine (LANTUS) injection 12 Units  12 Units Subcutaneous Daily Ivor Costa, MD   12 Units at 12/01/19 [phone]  . lidocaine (LIDODERM) 5 % 1 patch  1 patch Transdermal Q24H Ralene Muskrat B, MD   1 patch at 12/01/19 [phone]  . morphine 2 MG/ML injection 0.5 mg  0.5 mg Intravenous Q4H PRN Ivor Costa, MD      . ondansetron [facility]) injection 4 mg  4 mg Intravenous Q8H PRN Ivor Costa, MD      .  senna-docusate (Senokot-S) tablet 1 tablet  1 tablet Oral QHS PRN Lorretta Harp, MD      . simvastatin (ZOCOR) tablet 20 mg  20 mg Oral QHS Lorretta Harp, MD   20 mg at 11/30/19 2020  . sotalol (BETAPACE) tablet 80 mg  80 mg Oral QHS Lorretta Harp, MD   80 mg at 11/30/19 2020     Discharge Medications: Please see discharge summary for a list of discharge medications.  Relevant Imaging Results:  Relevant Lab Results:   Additional Information SSN:[id number]  Maree Krabbe, LCSW

## 2019-12-02 LAB — CBC WITH DIFFERENTIAL/PLATELET
Abs Immature Granulocytes: 0.02 10*3/uL (ref 0.00–0.07)
Basophils Absolute: 0.1 10*3/uL (ref 0.0–0.1)
Basophils Relative: 1 %
Eosinophils Absolute: 0.4 10*3/uL (ref 0.0–0.5)
Eosinophils Relative: 5 %
HCT: 34.5 % — ABNORMAL LOW (ref 36.0–46.0)
Hemoglobin: 11.1 g/dL — ABNORMAL LOW (ref 12.0–15.0)
Immature Granulocytes: 0 %
Lymphocytes Relative: 33 %
Lymphs Abs: 2.5 10*3/uL (ref 0.7–4.0)
MCH: 29.2 pg (ref 26.0–34.0)
MCHC: 32.2 g/dL (ref 30.0–36.0)
MCV: 90.8 fL (ref 80.0–100.0)
Monocytes Absolute: 1.1 10*3/uL — ABNORMAL HIGH (ref 0.1–1.0)
Monocytes Relative: 14 %
Neutro Abs: 3.7 10*3/uL (ref 1.7–7.7)
Neutrophils Relative %: 47 %
Platelets: 224 10*3/uL (ref 150–400)
RBC: 3.8 MIL/uL — ABNORMAL LOW (ref 3.87–5.11)
RDW: 13.2 % (ref 11.5–15.5)
WBC: 7.7 10*3/uL (ref 4.0–10.5)
nRBC: 0 % (ref 0.0–0.2)

## 2019-12-02 LAB — BASIC METABOLIC PANEL
Anion gap: 11 (ref 5–15)
BUN: 19 mg/dL (ref 8–23)
CO2: 26 mmol/L (ref 22–32)
Calcium: 8.8 mg/dL — ABNORMAL LOW (ref 8.9–10.3)
Chloride: 100 mmol/L (ref 98–111)
Creatinine, Ser: 1.26 mg/dL — ABNORMAL HIGH (ref 0.44–1.00)
GFR calc Af Amer: 47 mL/min — ABNORMAL LOW (ref >60)
GFR calc non Af Amer: 41 mL/min — ABNORMAL LOW (ref >60)
Glucose, Bld: 146 mg/dL — ABNORMAL HIGH (ref 70–99)
Potassium: 3.1 mmol/L — ABNORMAL LOW (ref 3.5–5.1)
Sodium: 137 mmol/L (ref 135–145)

## 2019-12-02 LAB — GLUCOSE, CAPILLARY
Glucose-Capillary: 119 mg/dL — ABNORMAL HIGH (ref 70–99)
Glucose-Capillary: 121 mg/dL — ABNORMAL HIGH (ref 70–99)
Glucose-Capillary: 194 mg/dL — ABNORMAL HIGH (ref 70–99)
Glucose-Capillary: 185 mg/dL — ABNORMAL HIGH (ref 70–99)
Glucose-Capillary: 193 mg/dL — ABNORMAL HIGH (ref 70–99)

## 2019-12-02 MED ORDER — DILTIAZEM HCL 30 MG PO TABS
120.0000 mg | ORAL_TABLET | Freq: Every morning | ORAL | Status: DC
  Administered 2019-12-03: 120 mg via ORAL
  Filled 2019-12-02: qty 4

## 2019-12-02 MED ORDER — POTASSIUM CHLORIDE 10 MEQ/100ML IV SOLN
10.0000 meq | INTRAVENOUS | Status: AC
  Administered 2019-12-02 (×3): 10 meq via INTRAVENOUS
  Filled 2019-12-02 (×3): qty 100

## 2019-12-02 MED ORDER — POTASSIUM CHLORIDE 20 MEQ/15ML (10%) PO SOLN
20.0000 meq | Freq: Once | ORAL | Status: AC
  Administered 2019-12-02: 20 meq via ORAL
  Filled 2019-12-02: qty 15

## 2019-12-02 MED ORDER — SODIUM CHLORIDE 0.9 % IV SOLN
INTRAVENOUS | Status: DC | PRN
  Administered 2019-12-02: 10 mL via INTRAVENOUS

## 2019-12-02 NOTE — Progress Notes (Signed)
PROGRESS NOTE    Tanya Aguirre  BSJ:[id number] DOB: 03-02-1941 DOA: 11/29/2019 PCP: Alan Mulder, MD   Brief Narrative:  HPI: Tanya Aguirre is a 79 y.o. female with medical history significant of hypertension, hyperlipidemia, diabetes mellitus, atrial fibrillation not on anticoagulants, who presents with dizziness, nausea vomiting and abdominal pain.  Per her daughter-in-law, patient started having dizziness and a difficulty walking at about 10 PM last night.  She also has some mild slurred speech, but no vision loss or hearing loss.  Patient does not have unilateral numbness or weakness in extremities.  Patient has headache.  She also reports nausea, vomiting and abdominal pain.  She has vomited several times with nonbloody nonbilious vomitus.  Her abdominal pain is located in the central abdomen, constant, aching, moderate, nonradiating.  No diarrhea.  No fever or chills.  No symptoms of UTI.  Patient does not have chest pain, shortness breath, cough  5/21: Patient seen and examined.  Spouse and daughter at bedside.  Patient reports feeling better.  MRI brain shows moderate sized infarct.  Follow-up by neurology appreciated.  PT and OT consults pending.  5/22: Patient seen and examined.  No family members at bedside this morning.  Patient feels well.  Still with some left-sided weakness.  No bleeding noted on CTA.  Plan to start therapeutic anticoagulation on day 5 post stroke.  No reason for dual antiplatelet therapy per neurology.  5/23: Patient seen and examined.  Patient feels well.  Still with left-sided weakness.  Patient was made inpatient status after discussion with [facility] team yesterday.  She could potentially be reevaluated for placement at CIR.  I had a discussion with the patient about her desired disposition.  She states she would prefer to go home however is willing to possibly entertain CIR or rehab admission.   Assessment & Plan:   Principal Problem:   Stroke  [facility]) Active Problems:   A-fib (HCC)   Diabetes mellitus without complication (HCC)   Hypertension   Abdominal pain   HLD (hyperlipidemia)   CVA (cerebral vascular accident) (HCC)  Acute CVA  Ct-head showed possible right cerebellar infarct MRI confirms moderate size infarct Patient has known history of atrial fibrillation but was not on anticoagulation for unclear reasons Neurology is consulted Plan: ASA daily.  Eliquis to be started on day 5 (today is day 4) PT and OT consults: Initially was recommending CIR however given observation status at that time the recommendation was switched to SNF.  Now the patient is inpatient status.  I have discussed with bedside RN.  Request the [facility] team representative evaluate for CIR admission at this time.  In my opinion the patient can benefit from intensive inpatient rehabilitation and does have the functional status to tolerate intensive rehab.   A-fib (HCC)  pt is not on anticoagulants at home, unclear reason CHA2DS2-VASc is 7 Plan: -on ASA.  Plan to switch to Eliquis on 5/24 -Continue sotalol and Cardizem  Diabetes mellitus without complication (HCC) Most recent A1c not on record.  Patient is taking Metformin and glargine insulin 12-20 unit daily at home Plan: Glargine 12 units daily SSI Carb modified diet  Hypertension -Hold Lasix and amlodipine to allow permissive hypertension -As needed hydralazine for SBP>220 or dBP > 120 -pt is on Cardizem and sotalol for afib  Abdominal pain, resolved Patient has nausea vomiting and central abdominal pain.  Lipase 34.  Etiology is not clear. CT abd no etiology As needed Zofran and morphine  HLD (  hyperlipidemia): zocor  Pulmonary nodules Small and multiple Noted on CT abd Redemonstrated on CT chest with contrast Case discussed with pulmonary consultants Dr.Aleskerov He reviewed imaging, agrees results are concerning He agreed to see patient in the office post discharge His  recommendations and input are greatly appreciated   DVT prophylaxis: Lovenox Code Status: Full Family Communication: None today Disposition Plan:Status is: Inpatient  Remains inpatient appropriate because:Unsafe d/c plan   Dispo: The patient is from: Home              Anticipated d/c is to: CIR              Anticipated d/c date is: 1 day              Patient currently is medically stable to d/c.   Patient is medically stable for discharge at this time.  Pending decision from CIR and patient regarding disposition.  Consultants:   Neurology  Procedures:   2D echo  Antimicrobials:   None   Subjective: Seen and examined.  No complaints.  Objective: Vitals:   12/01/19 1937 12/01/19 2247 12/02/19 0506 12/02/19 0715  BP: 136/64 (!) 123/37 138/74 (!) 145/68  Pulse: 75 62 80 82  Resp: 20  18 17   Temp: 98.4 F (36.9 C) 99.3 F (37.4 C) 98.3 F (36.8 C) 98.4 F (36.9 C)  TempSrc: Oral Oral Oral Oral  SpO2: 97% 97% 96% 96%  Weight:   107.5 kg   Height:        Intake/Output Summary (Last 24 hours) at 12/02/2019 1053 Last data filed at 12/02/2019 0900 Gross per 24 hour  Intake 733 ml  Output 1050 ml  Net -317 ml   Filed Weights   11/29/19 2240 11/30/19 0423 12/02/19 0506  Weight: 107.6 kg 108.1 kg 107.5 kg    Examination:  General exam: Appears calm and comfortable  Respiratory system: Clear to auscultation. Respiratory effort normal. Cardiovascular system: S1 & S2 heard, RRR. No JVD, murmurs, rubs, gallops or clicks. No pedal edema. Gastrointestinal system: Abdomen is nondistended, soft and nontender. No organomegaly or masses felt. Normal bowel sounds heard. Central nervous system: Alert and oriented. No focal neurological deficits. Extremities: Symmetric 5 x 5 power. Skin: No rashes, lesions or ulcers Psychiatry: Judgement and insight appear normal. Mood & affect appropriate.     Data Reviewed: I have personally reviewed following labs and imaging  studies  CBC: Recent Labs  Lab 11/29/19 0923 12/01/19 0428 12/01/19 2341  WBC 9.8 7.6 7.7  NEUTROABS  --  4.5 3.7  HGB 11.9* 11.9* 11.1*  HCT 36.4 35.4* 34.5*  MCV 90.8 88.9 90.8  PLT 230 227 235   Basic Metabolic Panel: Recent Labs  Lab 11/29/19 0923 11/30/19 1258 12/01/19 0428 12/01/19 2341  NA 134* 138 139 137  K 3.8 3.4* 3.3* 3.1*  CL 97* 100 101 100  CO2 23 28 27 26   GLUCOSE 274* 111* 133* 146*  BUN 16 12 19 19   CREATININE 1.04* 1.13* 1.34* 1.26*  CALCIUM 9.0 8.9 8.9 8.8*  MG  --   --  2.0  --    GFR: Estimated Creatinine Clearance: 44.8 mL/min (A) (by C-G formula based on SCr of 1.26 mg/dL (H)). Liver Function Tests: No results for input(s): AST, ALT, ALKPHOS, BILITOT, PROT, ALBUMIN in the last 168 hours. Recent Labs  Lab 11/29/19 0923  LIPASE 34   No results for input(s): AMMONIA in the last 168 hours. Coagulation Profile: No results for input(s): INR, PROTIME  in the last 168 hours. Cardiac Enzymes: No results for input(s): CKTOTAL, CKMB, CKMBINDEX, TROPONINI in the last 168 hours. BNP (last 3 results) No results for input(s): PROBNP in the last 8760 hours. HbA1C: Recent Labs    11/30/19 0414  HGBA1C 6.9*   CBG: Recent Labs  Lab 12/01/19 1936 12/01/19 2023 12/01/19 2330 12/02/19 0416 12/02/19 0716  GLUCAP 226* 250* 145* 119* 121*   Lipid Profile: Recent Labs    11/30/19 0414  CHOL 142  HDL 61  LDLCALC 68  TRIG 67  CHOLHDL 2.3   Thyroid Function Tests: No results for input(s): TSH, T4TOTAL, FREET4, T3FREE, THYROIDAB in the last 72 hours. Anemia Panel: No results for input(s): VITAMINB12, FOLATE, FERRITIN, TIBC, IRON, RETICCTPCT in the last 72 hours. Sepsis Labs: No results for input(s): PROCALCITON, LATICACIDVEN in the last 168 hours.  Recent Results (from the past 240 hour(s))  SARS Coronavirus 2 by RT PCR (hospital order, performed in [facility] hospital lab) Nasopharyngeal Nasopharyngeal Swab     Status: None   Collection  Time: 11/29/19 12:07 PM   Specimen: Nasopharyngeal Swab  Result Value Ref Range Status   SARS Coronavirus 2 NEGATIVE NEGATIVE Final    Comment: (NOTE) SARS-CoV-2 target nucleic acids are NOT DETECTED. The SARS-CoV-2 RNA is generally detectable in upper and lower respiratory specimens during the acute phase of infection. The lowest concentration of SARS-CoV-2 viral copies this assay can detect is 250 copies / mL. A negative result does not preclude SARS-CoV-2 infection and should not be used as the sole basis for treatment or other patient management decisions.  A negative result may occur with improper specimen collection / handling, submission of specimen other than nasopharyngeal swab, presence of viral mutation(s) within the areas targeted by this assay, and inadequate number of viral copies (<250 copies / mL). A negative result must be combined with clinical observations, patient history, and epidemiological information. Fact Sheet for Patients:   BoilerBrush.com.cy Fact Sheet for Healthcare Providers: https://pope.com/ This test is not yet approved or cleared  by the [place] FDA and has been authorized for detection and/or diagnosis of SARS-CoV-2 by FDA under an Emergency Use Authorization (EUA).  This EUA will remain in effect (meaning this test can be used) for the duration of the COVID-19 declaration under Section 564(b)(1) of the Act, 21 U.S.C. section 360bbb-3(b)(1), unless the authorization is terminated or revoked sooner. Performed at [facility], [address] [place], [place] [address]          Radiology Studies: CT ANGIO HEAD W OR WO CONTRAST  Result Date: 11/30/2019 CLINICAL DATA:  Stroke on MRI, multiple lung nodules EXAM: CT ANGIOGRAPHY HEAD TECHNIQUE: Multidetector CT imaging of the head was performed using the standard protocol during bolus administration of intravenous contrast. Multiplanar  CT image reconstructions and MIPs were obtained to evaluate the vascular anatomy. CONTRAST:  OMNIPAQUE IOHEXOL 350 MG/ML SOLN COMPARISON:  None. FINDINGS: CT HEAD Brain: There is no acute intracranial hemorrhage. More well-defined hypoattenuation is present in the right cerebellar hemisphere consistent with evolving acute infarction. The small left cerebellar infarct is better seen on MRI. There remains no significant mass effect in the posterior fossa or hydrocephalus. Chronic inferior right frontal infarct. No new loss of gray-white differentiation. Vascular: There is intracranial atherosclerotic calcification at the skull base. Skull: Unremarkable. Sinuses: Visualized portions are aerated. Orbits: Visualized portions are unremarkable. CTA HEAD Anterior circulation: Intracranial internal carotid arteries patent with calcified plaque causing mild stenosis. Anterior and middle cerebral  arteries are patent. Posterior circulation: Intracranial vertebral arteries, basilar artery, and posterior cerebral arteries are patent. Right PICA origin is not well seen. AICA origins are patent. Right superior cerebellar artery origin not well seen and there appears to be diminished flow within this vessel. There are bilateral posterior communicating arteries. There is a 2 mm inferiorly directed outpouching at the right PCOM origin. The vessel may rise eccentrically rather than at the apex of this outpouching and aneurysm cannot be excluded. Venous sinuses: Not well evaluated on this study. IMPRESSION: Evolving acute right cerebellar infarct. Small left cerebellar infarct better seen on MRI. There remains no significant mass effect or hydrocephalus. No acute intracranial hemorrhage. Right PICA and SCA origins are not well seen. 2 mm infundibulum or aneurysm at the right posterior communicating artery origin. Electronically Signed   By: Guadlupe Spanish M.D.   On: 11/30/2019 15:11   CT CHEST W CONTRAST  Result Date:  11/30/2019 CLINICAL DATA:  Nodular densities on recent CT abdomen examination EXAM: CT CHEST WITH CONTRAST TECHNIQUE: Multidetector CT imaging of the chest was performed during intravenous contrast administration. CONTRAST:  OMNIPAQUE IOHEXOL 350 MG/ML SOLN COMPARISON:  100 mL Omnipaque 350. FINDINGS: Cardiovascular: Atherosclerotic calcifications of the thoracic aorta are noted. No aneurysmal dilatation or dissection is seen. Truncus anomaly is noted. Pulmonary artery as visualized is within normal limits. No significant coronary calcifications are noted. Heart is at the upper limits of normal in size. Mediastinum/Nodes: Thoracic inlet is within normal limits. No hilar or mediastinal adenopathy is noted. The esophagus is within normal limits. Lungs/Pleura: Lungs are well aerated bilaterally. Multiple bilateral subcentimeter nodules are noted throughout both lungs again suspicious for metastatic disease. No dominant nodule is seen. No sizable effusion is noted. Upper Abdomen: Upper abdomen demonstrates cholelithiasis without complicating factors. Normal excretion of contrast is noted from the kidneys. No other focal abnormality is seen. Musculoskeletal: Degenerative changes of the thoracic spine are noted. No lytic or sclerotic bony lesion is seen. IMPRESSION: Multiple subcentimeter sub solid nodules are seen highly suspicious for metastatic disease. Tissue sampling may be helpful. Cholelithiasis without complicating factors. Aortic Atherosclerosis (ICD10-I70.0). Electronically Signed   By: Alcide Clever M.D.   On: 11/30/2019 15:09        Scheduled Meds: . aspirin EC  325 mg Oral Daily  . diltiazem  120 mg Oral QHS  . enoxaparin (LOVENOX) injection  40 mg Subcutaneous Q24H  . insulin aspart  0-9 Units Subcutaneous Q4H  . insulin glargine  12 Units Subcutaneous Daily  . lidocaine  1 patch Transdermal Q24H  . simvastatin  20 mg Oral QHS  . sotalol  80 mg Oral QHS   Continuous Infusions: . sodium  chloride 10 mL (12/02/19 0116)     LOS: 1 day    Time spent: 35 minutes    Tresa Moore, MD [facility] Pager [phone]  If 7PM-7AM, please contact night-coverage 12/02/2019, 10:53 AM

## 2019-12-02 NOTE — Plan of Care (Signed)
  Problem: Education: Goal: Knowledge of General Education information will improve Description: Including pain rating scale, medication(s)/side effects and non-pharmacologic comfort measures Outcome: Progressing   Problem: Clinical Measurements: Goal: Cardiovascular complication will be avoided Outcome: Progressing   Problem: Activity: Goal: Risk for activity intolerance will decrease Outcome: Progressing   Problem: Safety: Goal: Ability to remain free from injury will improve Outcome: Progressing   

## 2019-12-02 NOTE — Progress Notes (Signed)
Cardizem rescheduled secondary to soft BP not allowing for safe admin of both cardizem and BB at the same time

## 2019-12-02 NOTE — Progress Notes (Signed)
Physical Therapy Treatment Patient Details Name: Tanya Aguirre MRN: [id number] DOB: 12-19-1940 Today's Date: 12/02/2019    History of Present Illness presented to ER secondary to acute onset of dizziness, nausea/vomiting, abdominal pain; admitted for TIA/CVA work up.  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    PT Comments    Ready for session.  She is able to get out of bed without assist and stand to RW with min guard.  She is able to walk around small pod then continue around larger nursing stating with RW and min guard.  No LOB or buckling with minimal fatigue.  After seated rest she is able to stand for ex's with walker support.  HR in upper 120's at times with gait with monitor reading a-fib and occasionally irregular rhythm.  During standing tx she did briefly increase to 149 BPM but quickly returned to baseline.  She asked why alarm was going off.  Education provided and she voiced concern "I just don't feel it when it does that" and asking how she knows if she is in a-fib at home.  Education provided and she is encouraged to have further conversations with her MD/RN regarding a-fib education.    Long discussion regarding discharge planning.  She did very well with mobility today and is questioning her need for SNF/CIR.  Agreed with pt that she is progressing well and that HHPT would be a good option for her.  Stated her husband is home and can assist as needed.  Will update discharge recommendations to reflect overall improved mobility and confidence.     Follow Up Recommendations  Home health PT;Supervision for mobility/OOB     Equipment Recommendations  Rolling walker with 5" wheels;3in1 (PT)    Recommendations for Other Services       Precautions / Restrictions Precautions Precautions: Fall Restrictions Weight Bearing Restrictions: No    Mobility  Bed Mobility Overal bed mobility: Modified Independent                 Transfers Overall transfer level: Modified independent   Transfers: Sit to/from Stand Sit to Stand: Modified independent (Device/Increase time)            Ambulation/Gait Ambulation/Gait assistance: Supervision Gait Distance (Feet): 250 Feet Assistive device: Rolling walker (2 wheeled) Gait Pattern/deviations: Step-through pattern Gait velocity: decreased   General Gait Details: pt demonstrated safe steady gait kinematics with use of AD.    Stairs             Wheelchair Mobility    Modified Rankin (Stroke Patients Only)       Balance Overall balance assessment: Needs assistance Sitting-balance support: No upper extremity supported;Feet supported Sitting balance-Leahy Scale: Good     Standing balance support: Bilateral upper extremity supported Standing balance-Leahy Scale: Good                              Cognition Arousal/Alertness: Awake/alert Behavior During Therapy: WFL for tasks assessed/performed Overall Cognitive Status: Within Functional Limits for tasks assessed                                        Exercises Other Exercises Other Exercises: standing ex with RW and min gaurd - heel raises, SLR, marches x 10 without buckling or LOB    General Comments General comments (  skin integrity, edema, etc.): feels comfortable leaving walker at end of bed and walking to recliner without LOB or buckling "I don't think I need this."      Pertinent Vitals/Pain Pain Assessment: No/denies pain    Home Living                      Prior Function            PT Goals (current goals can now be found in the care plan section) Progress towards PT goals: Progressing toward goals    Frequency    7X/week      PT Plan Discharge plan needs to be updated    Co-evaluation              AM-PAC PT "6 Clicks" Mobility   Outcome Measure  Help needed turning from your back to your side while in a flat bed  without using bedrails?: None Help needed moving from lying on your back to sitting on the side of a flat bed without using bedrails?: None Help needed moving to and from a bed to a chair (including a wheelchair)?: None Help needed standing up from a chair using your arms (e.g., wheelchair or bedside chair)?: None Help needed to walk in hospital room?: A Little Help needed climbing 3-5 steps with a railing? : A Little 6 Click Score: 22    End of Session Equipment Utilized During Treatment: Gait belt Activity Tolerance: Patient tolerated treatment well Patient left: in chair;with call bell/phone within reach;with chair alarm set Nurse Communication: Mobility status Hemiplegia - Right/Left: Right Hemiplegia - dominant/non-dominant: Dominant Hemiplegia - caused by: Cerebral infarction     Time: 6440-3474 PT Time Calculation (min) (ACUTE ONLY): 23 min  Charges:  $Gait Training: 8-22 mins $Therapeutic Exercise: 8-22 mins                    Danielle Dess, PTA 12/02/19, 2:29 PM

## 2019-12-03 LAB — GLUCOSE, CAPILLARY
Glucose-Capillary: 107 mg/dL — ABNORMAL HIGH (ref 70–99)
Glucose-Capillary: 115 mg/dL — ABNORMAL HIGH (ref 70–99)
Glucose-Capillary: 124 mg/dL — ABNORMAL HIGH (ref 70–99)
Glucose-Capillary: 190 mg/dL — ABNORMAL HIGH (ref 70–99)

## 2019-12-03 LAB — URINALYSIS, COMPLETE (UACMP) WITH MICROSCOPIC
Bilirubin Urine: NEGATIVE
Glucose, UA: NEGATIVE mg/dL
Hgb urine dipstick: NEGATIVE
Ketones, ur: NEGATIVE mg/dL
Nitrite: NEGATIVE
Protein, ur: NEGATIVE mg/dL
Specific Gravity, Urine: 1.012 (ref 1.005–1.030)
pH: 7 (ref 5.0–8.0)

## 2019-12-03 LAB — CBC WITH DIFFERENTIAL/PLATELET
Abs Immature Granulocytes: 0.01 10*3/uL (ref 0.00–0.07)
Basophils Absolute: 0.1 10*3/uL (ref 0.0–0.1)
Basophils Relative: 1 %
Eosinophils Absolute: 0.5 10*3/uL (ref 0.0–0.5)
Eosinophils Relative: 7 %
HCT: 34.8 % — ABNORMAL LOW (ref 36.0–46.0)
Hemoglobin: 11.6 g/dL — ABNORMAL LOW (ref 12.0–15.0)
Immature Granulocytes: 0 %
Lymphocytes Relative: 33 %
Lymphs Abs: 2.3 10*3/uL (ref 0.7–4.0)
MCH: 29.5 pg (ref 26.0–34.0)
MCHC: 33.3 g/dL (ref 30.0–36.0)
MCV: 88.5 fL (ref 80.0–100.0)
Monocytes Absolute: 0.9 10*3/uL (ref 0.1–1.0)
Monocytes Relative: 13 %
Neutro Abs: 3.2 10*3/uL (ref 1.7–7.7)
Neutrophils Relative %: 46 %
Platelets: 214 10*3/uL (ref 150–400)
RBC: 3.93 MIL/uL (ref 3.87–5.11)
RDW: 13.1 % (ref 11.5–15.5)
WBC: 7 10*3/uL (ref 4.0–10.5)
nRBC: 0 % (ref 0.0–0.2)

## 2019-12-03 LAB — BASIC METABOLIC PANEL
Anion gap: 9 (ref 5–15)
BUN: 16 mg/dL (ref 8–23)
CO2: 26 mmol/L (ref 22–32)
Calcium: 8.5 mg/dL — ABNORMAL LOW (ref 8.9–10.3)
Chloride: 103 mmol/L (ref 98–111)
Creatinine, Ser: 0.98 mg/dL (ref 0.44–1.00)
GFR calc Af Amer: >60 mL/min (ref >60)
GFR calc non Af Amer: 55 mL/min — ABNORMAL LOW (ref >60)
Glucose, Bld: 124 mg/dL — ABNORMAL HIGH (ref 70–99)
Potassium: 3.3 mmol/L — ABNORMAL LOW (ref 3.5–5.1)
Sodium: 138 mmol/L (ref 135–145)

## 2019-12-03 MED ORDER — APIXABAN 5 MG PO TABS: 5.0000 mg | ORAL_TABLET | Freq: Two times a day (BID) | ORAL | 2 refills | Status: DC

## 2019-12-03 MED ORDER — FUROSEMIDE 40 MG PO TABS: 40.0000 mg | ORAL_TABLET | Freq: Every day | ORAL | 0 refills | Status: DC

## 2019-12-03 MED ORDER — APIXABAN 5 MG PO TABS
5.0000 mg | ORAL_TABLET | Freq: Two times a day (BID) | ORAL | Status: DC
  Administered 2019-12-03: 5 mg via ORAL
  Filled 2019-12-03: qty 1

## 2019-12-03 MED ORDER — AMLODIPINE BESYLATE 5 MG PO TABS: 5.0000 mg | ORAL_TABLET | Freq: Every day | ORAL | 0 refills | Status: DC

## 2019-12-03 NOTE — TOC Transition Note (Signed)
Transition of Care [facility]) - CM/SW Discharge Note   Patient Details  Name: Tanya Aguirre MRN: [id number] Date of Birth: 1940/10/22  Transition of Care [facility]) CM/SW Contact:  Maree Krabbe, LCSW Phone Number: 12/03/2019, 12:50 PM   Clinical Narrative:   HH is arranged through [facility]. Equipment will be delivered at bedside. Pt states her son will pick her up. No additional needs noted at this time.    Final next level of care: Home w Home Health Services Barriers to Discharge: No Barriers Identified   Patient Goals and CMS Choice Patient states their goals for this hospitalization and ongoing recovery are:: to get better   Choice offered to / list presented to : Patient  Discharge Placement                    Patient and family notified of of transfer: 12/03/19  Discharge Plan and Services In-house Referral: NA   Post Acute Care Choice: Skilled Nursing Facility            DME Agency: [employer] Date DME Agency Contacted: 12/03/19 Time DME Agency Contacted: 1249 Representative spoke with at DME Agency: [facility] HH Arranged: PT, OT HH Agency: [facility] Date [facility] Agency Contacted: 12/03/19 Time HH Agency Contacted: 1250 Representative spoke with at [facility] Agency: [place]  Social Determinants of Health (SDOH) Interventions     Readmission Risk Interventions No flowsheet data found.

## 2019-12-03 NOTE — Progress Notes (Signed)
Physical Therapy Treatment Patient Details Name: Tanya Aguirre MRN: [id number] DOB: 1941-02-22 Today's Date: 12/03/2019    History of Present Illness presented to ER secondary to acute onset of dizziness, nausea/vomiting, abdominal pain; admitted for TIA/CVA work up.  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    PT Comments    Pt ready for session.  OOB to bathroom to void and brush teeth with no AD and supervision.  She is able to continue gait around large and small nursing units with RW and min guard/supervision with no LOB or safety deficits.  Returned to room and remained up in recliner.  She stated she is comfortable in room without AD but still feels better with RW in open areas.  Agreed with pt and encouraged her to use RW outside in the community, outside and with MD appts etc.  Encouraged it also in the home as needed per her comfort but overall does well in room/bathroom where she is able to use light touch assist on walls, doorways, bed etc.  Discussed discharge plan.  Initially CIR was recommended but she has progressed well towards goals with increased mobility and confidence.  She stated she is not interested in CIR at this time and feels she can manage at home with equipment and help from husband and hopes to go home today.  Equipment recommendations made and safety discussion had with pt.  She voices understanding and feels good about plan.   Follow Up Recommendations  Home health PT;Supervision for mobility/OOB     Equipment Recommendations  Rolling walker with 5" wheels;3in1 (PT)    Recommendations for Other Services       Precautions / Restrictions Restrictions Weight Bearing Restrictions: No    Mobility  Bed Mobility Overal bed mobility: Modified Independent                Transfers Overall transfer level: Modified independent     Sit to Stand: Modified independent (Device/Increase time)             Ambulation/Gait Ambulation/Gait assistance: Supervision Gait Distance (Feet): 250 Feet Assistive device: Rolling walker (2 wheeled);None Gait Pattern/deviations: Step-through pattern;Decreased step length - right;Decreased step length - left Gait velocity: decreased   General Gait Details: comfortable in room without AD with light hand touch assist as needed on walls etc but continues to feel more comfortable in open hallways with RW   Stairs             Wheelchair Mobility    Modified Rankin (Stroke Patients Only)       Balance Overall balance assessment: Mild deficits observed, not formally tested Sitting-balance support: Feet supported Sitting balance-Leahy Scale: Normal     Standing balance support: Bilateral upper extremity supported Standing balance-Leahy Scale: Good                              Cognition Arousal/Alertness: Awake/alert Behavior During Therapy: WFL for tasks assessed/performed Overall Cognitive Status: Within Functional Limits for tasks assessed                                        Exercises Other Exercises Other Exercises: to bathroom to void with supervision and no AD.  stood to brush teeth with no UE support or LOB    General Comments  Pertinent Vitals/Pain Pain Assessment: No/denies pain    Home Living                      Prior Function            PT Goals (current goals can now be found in the care plan section) Progress towards PT goals: Progressing toward goals    Frequency    7X/week      PT Plan Current plan remains appropriate    Co-evaluation              AM-PAC PT "6 Clicks" Mobility   Outcome Measure  Help needed turning from your back to your side while in a flat bed without using bedrails?: None Help needed moving from lying on your back to sitting on the side of a flat bed without using bedrails?: None Help needed moving to and from a bed to a  chair (including a wheelchair)?: None Help needed standing up from a chair using your arms (e.g., wheelchair or bedside chair)?: None Help needed to walk in hospital room?: A Little Help needed climbing 3-5 steps with a railing? : A Little 6 Click Score: 22    End of Session Equipment Utilized During Treatment: Gait belt Activity Tolerance: Patient tolerated treatment well Patient left: in chair;with call bell/phone within reach;with chair alarm set Nurse Communication: Mobility status Hemiplegia - Right/Left: Right Hemiplegia - dominant/non-dominant: Dominant Hemiplegia - caused by: Cerebral infarction     Time: 6948-5462 PT Time Calculation (min) (ACUTE ONLY): 13 min  Charges:  $Gait Training: 8-22 mins                    Danielle Dess, PTA 12/03/19, 9:25 AM

## 2019-12-03 NOTE — TOC Progression Note (Signed)
Transition of Care [facility]) - Progression Note    Patient Details  Name: Tanya Aguirre MRN: [id number] Date of Birth: 1940-11-17  [facility] CM/SW Contact  Maree Krabbe, LCSW Phone Number: 12/03/2019, 12:48 PM  Clinical Narrative: Pt is agreeable to [facility]. Pt would like to use [facility]. Pt needs a 3n1 and walker--will be delivered at bedside.     Expected Discharge Plan: Skilled Nursing Facility Barriers to Discharge: Continued Medical Work up, English as a second language teacher  Expected Discharge Plan and Services Expected Discharge Plan: Skilled Nursing Facility In-house Referral: NA   Post Acute Care Choice: Skilled Nursing Facility Living arrangements for the past 2 months: Single Family Home Expected Discharge Date: 12/03/19                                     Social Determinants of Health (SDOH) Interventions    Readmission Risk Interventions No flowsheet data found.

## 2019-12-03 NOTE — Discharge Summary (Signed)
Physician Discharge Summary  Tanya Aguirre MGQ:[id number] DOB: 15-Jun-1941 DOA: 11/29/2019  PCP: Alan Mulder, MD  Admit date: 11/29/2019 Discharge date: 12/03/2019  Admitted From: Home Disposition: Home with home health services  Recommendations for Outpatient Follow-up:  1. Follow up with PCP in 1-2 weeks 2. Request neurology referral from outpatient PCP  Home Health: Yes Equipment/Devices: No  Discharge Condition: Stable CODE STATUS: Full Diet recommendation: Heart Healthy / Carb Modified  Brief/Interim Summary: HPI: Tanya Aguirre Turneris a 79 y.o.femalewith medical history significant of hypertension, hyperlipidemia, diabetes mellitus, atrial fibrillation not on anticoagulants, who presents with dizziness, nausea vomiting and abdominal pain.  Per her daughter-in-law, patient started having dizziness and a difficulty walking at about 10 PM last night. She also has some mild slurred speech, but no vision loss or hearing loss. Patient does not have unilateral numbness or weakness in extremities. Patient has headache. She also reports nausea, vomiting and abdominal pain. She has vomited several times with nonbloody nonbilious vomitus. Her abdominal pain is located in the central abdomen, constant, aching, moderate, nonradiating. No diarrhea. No fever or chills. No symptoms of UTI. Patient does not have chest pain, shortness breath, cough  5/21: Patient seen and examined.  Spouse and daughter at bedside.  Patient reports feeling better.  MRI brain shows moderate sized infarct.  Follow-up by neurology appreciated.  PT and OT consults pending.  5/22: Patient seen and examined.  No family members at bedside this morning.  Patient feels well.  Still with some left-sided weakness.  No bleeding noted on CTA.  Plan to start therapeutic anticoagulation on day 5 post stroke.  No reason for dual antiplatelet therapy per neurology.  5/23: Patient seen and examined.  Patient feels  well.  Still with left-sided weakness.  Patient was made inpatient status after discussion with [facility] team yesterday.  She could potentially be reevaluated for placement at [facility].  I had a discussion with the patient about her desired disposition.  She states she would prefer to go home however is willing to possibly entertain CIR or rehab admission.  5/24: Patient has been making progress.  She was reevaluated by physical therapy who now recommending home with home health.  On discussion with the patient.  She is in agreeable to home with home health services.  Home health and DME ordered.  Communicated with case management will set up home health.  Stable for discharge.  Discharge Diagnoses:  Principal Problem:   Stroke [facility]) Active Problems:   A-fib (HCC)   Diabetes mellitus without complication (HCC)   Hypertension   Abdominal pain   HLD (hyperlipidemia)   CVA (cerebral vascular accident) (HCC)  Acute CVA  Ct-head showed possible right cerebellar infarct MRI confirms moderate size infarct Patient has known history of atrial fibrillation but was not on anticoagulation for unclear reasons Neurology is consulted Plan: Today is day 5 post stroke.  Patient can be started on Eliquis.  No need for antiplatelet therapy per neurology.  Initially patient was recommended CIR versus SNF however she progressed and physical therapy updated the recommendations to home with home health.  Patient will follow up with primary care physician within 1 week.  I recommended her to seek referral to outpatient neurology.   A-fib (HCC) pt is not on anticoagulants at home, unclear reason CHA2DS2-VASc is 7 Aspirin prescribed at day of discharge.  Continue sotalol and Cardizem for rate control  Diabetes mellitus without complication (HCC) Can continue home regimen on discharge  Hypertension Patient can  continue home regimen of Lasix, amlodipine, Cardizem, sotalol.  Blood pressure has been fairly well  controlled while in house and we have been allowing for permissive hypertension.  On discharge I would recommend that she decrease dose of amlodipine to 5 mg a day and decrease dose of furosemide to 40 mg a day.  She will continue same dose of diltiazem 120 mg daily and sotalol 80 mg twice daily.  Follow-up outpatient PCP  Abdominal pain, resolved Patient has nausea vomiting and central abdominal pain.  Lipase 34. Etiology is not clear. CT abd no etiology As needed Zofran and morphine  HLD (hyperlipidemia): zocor  Pulmonary nodules Small and multiple Noted on CT abd Redemonstrated on CT chest with contrast Case discussed with pulmonary consultant Dr.Aleskerov He reviewed imaging, agrees results are concerning He agreed to see patient in the office post discharge His recommendations and input are greatly appreciated  Discharge Instructions  Discharge Instructions    Diet - low sodium heart healthy   Complete by: As directed    Increase activity slowly   Complete by: As directed      Allergies as of 12/03/2019   No Known Allergies     Medication List    STOP taking these medications   aspirin EC 81 MG tablet     TAKE these medications   amLODipine 5 MG tablet Commonly known as: NORVASC Take 1 tablet (5 mg total) by mouth daily. What changed:   medication strength  how much to take   apixaban 5 MG Tabs tablet Commonly known as: ELIQUIS Take 1 tablet (5 mg total) by mouth 2 (two) times daily.   diltiazem 120 MG tablet Commonly known as: CARDIZEM Take 120 mg by mouth at bedtime.   furosemide 40 MG tablet Commonly known as: LASIX Take 1 tablet (40 mg total) by mouth daily. What changed: when to take this   metFORMIN 1000 MG tablet Commonly known as: GLUCOPHAGE Take 1,000 mg by mouth 2 (two) times daily.   potassium chloride SA 20 MEQ tablet Commonly known as: KLOR-CON Take 20 mEq by mouth daily.   simvastatin 20 MG tablet Commonly known as:  ZOCOR Take 20 mg by mouth at bedtime.   sotalol 80 MG tablet Commonly known as: BETAPACE Take 80 mg by mouth at bedtime.   Toujeo Max SoloStar 300 UNIT/ML Solostar Pen Generic drug: insulin glargine (2 Unit Dial) Inject 12-20 Units into the skin daily.   Vitamin D (Ergocalciferol) 1.25 MG (50000 UNIT) Caps capsule Commonly known as: DRISDOL Take 50,000 Units by mouth once a week.            Durable Medical Equipment  (From admission, onward)         Start     Ordered   12/03/19 1034  For home use only DME Walker rolling  Once    Question Answer Comment  Walker: With 5 Inch Wheels   Patient needs a walker to treat with the following condition Weakness      12/03/19 1034         Follow-up Information    Morayati, Delsa Sale, MD. Schedule an appointment as soon as possible for a visit in 1 week(s).   Specialty: Endocrinology Contact information: [address] [place] [address] [phone]          No Known Allergies  Consultations:  Neurology   Procedures/Studies: CT ANGIO HEAD W OR WO CONTRAST  Result Date: 11/30/2019 CLINICAL DATA:  Stroke on MRI, multiple lung  nodules EXAM: CT ANGIOGRAPHY HEAD TECHNIQUE: Multidetector CT imaging of the head was performed using the standard protocol during bolus administration of intravenous contrast. Multiplanar CT image reconstructions and MIPs were obtained to evaluate the vascular anatomy. CONTRAST:  192mL OMNIPAQUE IOHEXOL 350 MG/ML SOLN COMPARISON:  None. FINDINGS: CT HEAD Brain: There is no acute intracranial hemorrhage. More well-defined hypoattenuation is present in the right cerebellar hemisphere consistent with evolving acute infarction. The small left cerebellar infarct is better seen on MRI. There remains no significant mass effect in the posterior fossa or hydrocephalus. Chronic inferior right frontal infarct. No new loss of gray-white differentiation. Vascular: There is intracranial atherosclerotic  calcification at the skull base. Skull: Unremarkable. Sinuses: Visualized portions are aerated. Orbits: Visualized portions are unremarkable. CTA HEAD Anterior circulation: Intracranial internal carotid arteries patent with calcified plaque causing mild stenosis. Anterior and middle cerebral arteries are patent. Posterior circulation: Intracranial vertebral arteries, basilar artery, and posterior cerebral arteries are patent. Right PICA origin is not well seen. AICA origins are patent. Right superior cerebellar artery origin not well seen and there appears to be diminished flow within this vessel. There are bilateral posterior communicating arteries. There is a 2 mm inferiorly directed outpouching at the right PCOM origin. The vessel may rise eccentrically rather than at the apex of this outpouching and aneurysm cannot be excluded. Venous sinuses: Not well evaluated on this study. IMPRESSION: Evolving acute right cerebellar infarct. Small left cerebellar infarct better seen on MRI. There remains no significant mass effect or hydrocephalus. No acute intracranial hemorrhage. Right PICA and SCA origins are not well seen. 2 mm infundibulum or aneurysm at the right posterior communicating artery origin. Electronically Signed   By: Macy Mis M.D.   On: 11/30/2019 15:11   CT HEAD WO CONTRAST  Result Date: 11/29/2019 CLINICAL DATA:  Recent fall with facial pain, initial encounter EXAM: CT HEAD WITHOUT CONTRAST TECHNIQUE: Contiguous axial images were obtained from the base of the skull through the vertex without intravenous contrast. COMPARISON:  None. FINDINGS: Brain: There is a geographic area of decreased attenuation identified within the right cerebellar hemisphere consistent with acute to subacute ischemia. Mild atrophic changes are noted commenced with the patient's given age. No findings to suggest acute hemorrhage or space-occupying mass lesion are noted. Vascular: No hyperdense vessel or unexpected  calcification. Skull: Normal. Negative for fracture or focal lesion. Sinuses/Orbits: No acute finding. Other: None. IMPRESSION: Geographic area of decreased attenuation within the right cerebellar hemisphere superiorly consistent with acute to subacute ischemia. MRI may be helpful further evaluation. Electronically Signed   By: Inez Catalina M.D.   On: 11/29/2019 09:34   CT CHEST W CONTRAST  Result Date: 11/30/2019 CLINICAL DATA:  Nodular densities on recent CT abdomen examination EXAM: CT CHEST WITH CONTRAST TECHNIQUE: Multidetector CT imaging of the chest was performed during intravenous contrast administration. CONTRAST:  158mL OMNIPAQUE IOHEXOL 350 MG/ML SOLN COMPARISON:  100 mL Omnipaque 350. FINDINGS: Cardiovascular: Atherosclerotic calcifications of the thoracic aorta are noted. No aneurysmal dilatation or dissection is seen. Truncus anomaly is noted. Pulmonary artery as visualized is within normal limits. No significant coronary calcifications are noted. Heart is at the upper limits of normal in size. Mediastinum/Nodes: Thoracic inlet is within normal limits. No hilar or mediastinal adenopathy is noted. The esophagus is within normal limits. Lungs/Pleura: Lungs are well aerated bilaterally. Multiple bilateral subcentimeter nodules are noted throughout both lungs again suspicious for metastatic disease. No dominant nodule is seen. No sizable effusion is noted. Upper Abdomen: Upper  abdomen demonstrates cholelithiasis without complicating factors. Normal excretion of contrast is noted from the kidneys. No other focal abnormality is seen. Musculoskeletal: Degenerative changes of the thoracic spine are noted. No lytic or sclerotic bony lesion is seen. IMPRESSION: Multiple subcentimeter sub solid nodules are seen highly suspicious for metastatic disease. Tissue sampling may be helpful. Cholelithiasis without complicating factors. Aortic Atherosclerosis (ICD10-I70.0). Electronically Signed   By: Alcide Clever  M.D.   On: 11/30/2019 15:09   MR BRAIN WO CONTRAST  Result Date: 11/29/2019 CLINICAL DATA:  Abnormal CT EXAM: MRI HEAD WITHOUT CONTRAST TECHNIQUE: Multiplanar, multiecho pulse sequences of the brain and surrounding structures were obtained without intravenous contrast. COMPARISON:  Correlation made with CT earlier same day FINDINGS: Motion artifact is present. Brain: Moderate size acute infarction of the right cerebellar hemisphere. Subcentimeter acute infarct of the left cerebellum. There is no significant mass effect or hydrocephalus. Chronic inferior right frontal infarct. Punctate focus of susceptibility adjacent to the posterior body of the right lateral ventricle compatible with chronic microhemorrhage or less likely mineralization. Patchy T2 hyperintensity in the supratentorial white matter is nonspecific but may reflect mild chronic microvascular ischemic changes. Prominence of the ventricles and sulci reflects minor generalized parenchymal volume loss. There is no intracranial mass, mass effect, or edema. There is no hydrocephalus or extra-axial fluid collection. No abnormal enhancement. Vascular: Major vessel flow voids at the skull base are preserved. Skull and upper cervical spine: Normal marrow signal is preserved. Sinuses/Orbits: Minor mucosal thickening.  Orbits are unremarkable. Other: Sella is unremarkable.  Mastoid air cells are clear. 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 Subcentimeter acute infarct of the left cerebellum. No significant mass effect or hydrocephalus. Chronic inferior right frontal infarct. Mild chronic microvascular ischemic changes. Electronically Signed   By: Guadlupe Spanish M.D.   On: 11/29/2019 15:20   CT ABDOMEN PELVIS W CONTRAST  Result Date: 11/29/2019 CLINICAL DATA:  Nausea and vomiting. EXAM: CT ABDOMEN AND PELVIS WITH CONTRAST TECHNIQUE: Multidetector CT imaging of the abdomen and pelvis was performed using the standard protocol  following bolus administration of intravenous contrast. CONTRAST:  OMNIPAQUE IOHEXOL 300 MG/ML  SOLN COMPARISON:  None. FINDINGS: Lower chest: Numerous ill-defined subcentimeter lung nodules are seen scattered throughout both lungs. Hepatobiliary: No focal liver abnormality is seen. Subcentimeter gallstones are seen within the gallbladder lumen without gallbladder wall thickening, or biliary dilatation. Pancreas: Unremarkable. No pancreatic ductal dilatation or surrounding inflammatory changes. Spleen: Normal in size without focal abnormality. Adrenals/Urinary Tract: Adrenal glands are unremarkable. Kidneys are normal, without renal calculi, focal lesion, or hydronephrosis. Bladder is unremarkable. Stomach/Bowel: Stomach is within normal limits. Appendix appears normal. No evidence of bowel wall thickening, distention, or inflammatory changes. Noninflamed diverticula are seen throughout the sigmoid colon. Vascular/Lymphatic: There is moderate severity calcification of the abdominal aorta. No enlarged abdominal or pelvic lymph nodes. Reproductive: Multiple calcified fibroids of various sizes are seen within the uterus. An ill-defined 1.6 cm right adnexal cyst is noted. Other: There is a 6.0 cm x 4.9 cm fat containing umbilical hernia. Musculoskeletal: A chronic appearing deformity is seen along the anterior aspect of the superior endplate of the L4 vertebral body. Moderate to marked severity degenerative changes are seen at the levels of L4-L5 and L5-S1. IMPRESSION: 1. Numerous ill-defined subcentimeter lung nodules scattered throughout both lungs worrisome for metastatic disease. 2. Cholelithiasis without evidence of acute cholecystitis. 3. 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 4. 6.0 cm x 4.9 cm fat containing umbilical hernia.  5. Chronic appearing deformity along the anterior aspect of the superior endplate of the L4 vertebral body. 6. Moderate to marked severity degenerative  changes at the levels of L4-L5 and L5-S1. Aortic Atherosclerosis (ICD10-I70.0). Electronically Signed   By: Aram Candela M.D.   On: 11/29/2019 18:40   US Carotid Bilateral (at [facility] and [facility] only)  Result Date: 11/29/2019 CLINICAL DATA:  79 year old female with a history of stroke symptoms EXAM: BILATERAL CAROTID DUPLEX ULTRASOUND TECHNIQUE: Wallace Cullens scale imaging, color Doppler and duplex ultrasound were performed of bilateral carotid and vertebral arteries in the neck. COMPARISON:  None. FINDINGS: Criteria: Quantification of carotid stenosis is based on velocity parameters that correlate the residual internal carotid diameter with NASCET-based stenosis levels, using the diameter of the distal internal carotid lumen as the denominator for stenosis measurement. The following velocity measurements were obtained: RIGHT ICA:  Systolic 102 cm/sec, Diastolic 30 cm/sec CCA:  92 cm/sec SYSTOLIC ICA/CCA RATIO:  1.1 ECA:  83 cm/sec LEFT ICA:  Systolic 70 cm/sec, Diastolic 12 cm/sec CCA:  56 cm/sec SYSTOLIC ICA/CCA RATIO:  1.3 ECA:  114 cm/sec Right Brachial SBP: Not acquired Left Brachial SBP: Not acquired RIGHT CAROTID ARTERY: No significant calcifications of the right common carotid artery. Intermediate waveform maintained. Heterogeneous and partially calcified plaque at the right carotid bifurcation. No significant lumen shadowing. Low resistance waveform of the right ICA. No significant tortuosity. RIGHT VERTEBRAL ARTERY: Antegrade flow with low resistance waveform. LEFT CAROTID ARTERY: No significant calcifications of the left common carotid artery. Intermediate waveform maintained. Heterogeneous and partially calcified plaque at the left carotid bifurcation without significant lumen shadowing. Low resistance waveform of the left ICA. No significant tortuosity. LEFT VERTEBRAL ARTERY:  Antegrade flow with low resistance waveform. IMPRESSION: Color duplex indicates minimal heterogeneous and calcified plaque, with no  hemodynamically significant stenosis by duplex criteria in the extracranial cerebrovascular circulation. Signed, Yvone Neu. Reyne Dumas, RPVI [facility] Radiology Electronically Signed   By: Gilmer Mor D.O.   On: 11/29/2019 14:27   ECHOCARDIOGRAM COMPLETE  Result Date: 11/30/2019    ECHOCARDIOGRAM REPORT   Patient Name:   [place] ANN Fragoso Date of Exam: 11/30/2019 Medical Rec #:  [id number]         Height:       65.0 in Accession #:    [id number]        Weight:       238.4 lb Date of Birth:  1941-04-09          BSA:          2.131 m Patient Age:    79 years          BP:           139/53 mmHg Patient Gender: F                 HR:           68 bpm. Exam Location:  [facility] Procedure: 2D Echo, Color Doppler and Cardiac Doppler Indications:     I163.9 Stroke  History:         Patient has no prior history of Echocardiogram examinations.                  Arrythmias:Atrial Fibrillation; Risk Factors:Hypertension and                  Diabetes.  Sonographer:     Humphrey Rolls RDCS (AE) Referring Phys:  Kern Reap Brien Few NIU Diagnosing Phys: Julien Nordmann  MD  Sonographer Comments: Suboptimal subcostal window. IMPRESSIONS  1. Left ventricular ejection fraction, by estimation, is 60 to 65%. The left ventricle has normal function. The left ventricle has no regional wall motion abnormalities. Left ventricular diastolic parameters are indeterminate.  2. Right ventricular systolic function is normal. The right ventricular size is normal.  3. Left atrial size was mildly dilated.  4. The mitral valve is normal in structure. Mild mitral valve regurgitation.  5. Rhythm is atrial fibrillation FINDINGS  Left Ventricle: Left ventricular ejection fraction, by estimation, is 60 to 65%. The left ventricle has normal function. The left ventricle has no regional wall motion abnormalities. The left ventricular internal cavity size was normal in size. There is  no left ventricular hypertrophy. Left  ventricular diastolic parameters are indeterminate. Right Ventricle: The right ventricular size is normal. No increase in right ventricular wall thickness. Right ventricular systolic function is normal. Left Atrium: Left atrial size was mildly dilated. Right Atrium: Right atrial size was normal in size. Pericardium: There is no evidence of pericardial effusion. Mitral Valve: The mitral valve is normal in structure. Normal mobility of the mitral valve leaflets. Mild mitral valve regurgitation. No evidence of mitral valve stenosis. MV peak gradient, 5.3 mmHg. The mean mitral valve gradient is 1.0 mmHg. Tricuspid Valve: The tricuspid valve is normal in structure. Tricuspid valve regurgitation is not demonstrated. No evidence of tricuspid stenosis. Aortic Valve: The aortic valve is normal in structure. Aortic valve regurgitation is not visualized. No aortic stenosis is present. Aortic valve mean gradient measures 2.0 mmHg. Aortic valve peak gradient measures 4.7 mmHg. Aortic valve area, by VTI measures 3.13 cm. Pulmonic Valve: The pulmonic valve was normal in structure. Pulmonic valve regurgitation is not visualized. No evidence of pulmonic stenosis. Aorta: The aortic root is normal in size and structure. Venous: The inferior vena cava is normal in size with greater than 50% respiratory variability, suggesting right atrial pressure of 3 mmHg. IAS/Shunts: No atrial level shunt detected by color flow Doppler.  LEFT VENTRICLE PLAX 2D LVIDd:         4.35 cm  Diastology LVIDs:         2.39 cm  LV e' lateral:   11.50 cm/s LV PW:         0.92 cm  LV E/e' lateral: 9.6 LV IVS:        0.78 cm  LV e' medial:    8.05 cm/s LVOT diam:     2.20 cm  LV E/e' medial:  13.7 LV SV:         65 LV SV Index:   30 LVOT Area:     3.80 cm  LEFT ATRIUM             Index LA diam:        3.80 cm 1.78 cm/m LA Vol (A2C):   51.0 ml 23.93 ml/m LA Vol (A4C):   70.6 ml 33.12 ml/m LA Biplane Vol: 62.8 ml 29.46 ml/m  AORTIC VALVE                    PULMONIC VALVE AV Area (Vmax):    2.91 cm    PV Vmax:       0.73 m/s AV Area (Vmean):   3.12 cm    PV Vmean:      50.500 cm/s AV Area (VTI):     3.13 cm    PV VTI:        0.122 m AV Vmax:  108.00 cm/s PV Peak grad:  2.1 mmHg AV Vmean:          69.100 cm/s PV Mean grad:  1.0 mmHg AV VTI:            0.208 m AV Peak Grad:      4.7 mmHg AV Mean Grad:      2.0 mmHg LVOT Vmax:         82.80 cm/s LVOT Vmean:        56.700 cm/s LVOT VTI:          0.171 m LVOT/AV VTI ratio: 0.82  AORTA Ao Root diam: 2.70 cm MITRAL VALVE MV Area (PHT): 4.59 cm     SHUNTS MV Peak grad:  5.3 mmHg     Systemic VTI:  0.17 m MV Mean grad:  1.0 mmHg     Systemic Diam: 2.20 cm MV Vmax:       1.15 m/s MV Vmean:      51.2 cm/s MV Decel Time: 165 msec MV E velocity: 110.33 cm/s MV A velocity: 49.50 cm/s MV E/A ratio:  2.23 Julien Nordmannimothy Gollan MD Electronically signed by Julien Nordmannimothy Gollan MD Signature Date/Time: 11/30/2019/1:28:47 PM    Final     (Echo, Carotid, EGD, Colonoscopy, ERCP)    Subjective: Patient seen and examined.  Stable for discharge.  No complaints.  All questions answered  Discharge Exam: Vitals:   12/03/19 0519 12/03/19 0728  BP: 136/60 134/70  Pulse: 79 80  Resp: 16 19  Temp: 98.2 F (36.8 C) 98.3 F (36.8 C)  SpO2: 95% 98%   Vitals:   12/03/19 0501 12/03/19 0504 12/03/19 0519 12/03/19 0728  BP:  132/81 136/60 134/70  Pulse:  78 79 80  Resp:   16 19  Temp:  98.2 F (36.8 C) 98.2 F (36.8 C) 98.3 F (36.8 C)  TempSrc:  Oral Oral   SpO2:  99% 95% 98%  Weight: 106.9 kg     Height:        General: Pt is alert, awake, not in acute distress Cardiovascular: RRR, S1/S2 +, no rubs, no gallops Respiratory: CTA bilaterally, no wheezing, no rhonchi Abdominal: Soft, NT, ND, bowel sounds + Extremities: no edema, no cyanosis    The results of significant diagnostics from this hospitalization (including imaging, microbiology, ancillary and laboratory) are listed below for reference.      Microbiology: Recent Results (from the past 240 hour(s))  SARS Coronavirus 2 by RT PCR (hospital order[facility]) Nasopharyngeal Nasopharyngeal Swab     Status: None   Collection Time: 11/29/19 12:07 PM   Specimen: Nasopharyngeal Swab  Result Value Ref Range Status   SARS Coronavirus 2 NEGATIVE NEGATIVE Final    Comment: (NOTE) SARS-CoV-2 target nucleic acids are NOT DETECTED. The SARS-CoV-2 RNA is generally detectable in upper and lower respiratory specimens during the acute phase of infection. The lowest concentration of SARS-CoV-2 viral copies this assay can detect is 250 copies / mL. A negative result does not preclude SARS-CoV-2 infection and should not be used as the sole basis for treatment or other patient management decisions.  A negative result may occur with improper specimen collection / handling, submission of specimen other than nasopharyngeal swab, presence of viral mutation(s) within the areas targeted by this assay, and inadequate number of viral copies (<250 copies / mL). A negative result must be combined with clinical observations, patient history, and epidemiological information. Fact Sheet for Patients:   BoilerBrush.com.cyhttps://www.fda.gov/media/136312/download Fact Sheet for Healthcare  Providers: https://pope.com/ This test is not yet approved or cleared  by the [place] and has been authorized for detection and/or diagnosis of SARS-CoV-2 by FDA under an Emergency Use Authorization (EUA).  This EUA will remain in effect (meaning this test can be used) for the duration of the COVID-19 declaration under Section 564(b)(1) of the Act, 21 U.S.C. section 360bbb-3(b)(1), unless the authorization is terminated or revoked sooner. Performed at [facility], [address] [place], [place] [address]      Labs: BNP (last 3 results) No results for input(s): BNP in the last 8760 hours. Basic Metabolic  Panel: Recent Labs  Lab 11/29/19 0923 11/30/19 1258 12/01/19 0428 12/01/19 2341 12/03/19 0546  NA 134* 138 139 137 138  K 3.8 3.4* 3.3* 3.1* 3.3*  CL 97* 100 101 100 103  CO2 23 28 27 26 26   GLUCOSE 274* 111* 133* 146* 124*  BUN 16 12 19 19 16   CREATININE 1.04* 1.13* 1.34* 1.26* 0.98  CALCIUM 9.0 8.9 8.9 8.8* 8.5*  MG  --   --  2.0  --   --    Liver Function Tests: No results for input(s): AST, ALT, ALKPHOS, BILITOT, PROT, ALBUMIN in the last 168 hours. Recent Labs  Lab 11/29/19 0923  LIPASE 34   No results for input(s): AMMONIA in the last 168 hours. CBC: Recent Labs  Lab 11/29/19 0923 12/01/19 0428 12/01/19 2341 12/03/19 0546  WBC 9.8 7.6 7.7 7.0  NEUTROABS  --  4.5 3.7 3.2  HGB 11.9* 11.9* 11.1* 11.6*  HCT 36.4 35.4* 34.5* 34.8*  MCV 90.8 88.9 90.8 88.5  PLT 230 227 224 214   Cardiac Enzymes: No results for input(s): CKTOTAL, CKMB, CKMBINDEX, TROPONINI in the last 168 hours. BNP: Invalid input(s): POCBNP CBG: Recent Labs  Lab 12/02/19 1646 12/02/19 1921 12/03/19 0026 12/03/19 0447 12/03/19 0728  GLUCAP 185* 193* 107* 124* 115*   D-Dimer No results for input(s): DDIMER in the last 72 hours. Hgb A1c No results for input(s): HGBA1C in the last 72 hours. Lipid Profile No results for input(s): CHOL, HDL, LDLCALC, TRIG, CHOLHDL, LDLDIRECT in the last 72 hours. Thyroid function studies No results for input(s): TSH, T4TOTAL, T3FREE, THYROIDAB in the last 72 hours.  Invalid input(s): FREET3 Anemia work up No results for input(s): VITAMINB12, FOLATE, FERRITIN, TIBC, IRON, RETICCTPCT in the last 72 hours. Urinalysis    Component Value Date/Time   COLORURINE YELLOW (A) 12/02/2019 1022   APPEARANCEUR CLEAR (A) 12/02/2019 1022   LABSPEC 1.012 12/02/2019 1022   PHURINE 7.0 12/02/2019 1022   GLUCOSEU NEGATIVE 12/02/2019 1022   HGBUR NEGATIVE 12/02/2019 1022   BILIRUBINUR NEGATIVE 12/02/2019 1022   KETONESUR NEGATIVE 12/02/2019 1022   PROTEINUR NEGATIVE  12/02/2019 1022   NITRITE NEGATIVE 12/02/2019 1022   LEUKOCYTESUR TRACE (A) 12/02/2019 1022   Sepsis Labs Invalid input(s): PROCALCITONIN,  WBC,  LACTICIDVEN Microbiology Recent Results (from the past 240 hour(s))  SARS Coronavirus 2 by RT PCR (hospital order, performed in [facility] hospital lab) Nasopharyngeal Nasopharyngeal Swab     Status: None   Collection Time: 11/29/19 12:07 PM   Specimen: Nasopharyngeal Swab  Result Value Ref Range Status   SARS Coronavirus 2 NEGATIVE NEGATIVE Final    Comment: (NOTE) SARS-CoV-2 target nucleic acids are NOT DETECTED. The SARS-CoV-2 RNA is generally detectable in upper and lower respiratory specimens during the acute phase of infection. The lowest concentration of SARS-CoV-2 viral copies this assay can detect is 250 copies / mL.  A negative result does not preclude SARS-CoV-2 infection and should not be used as the sole basis for treatment or other patient management decisions.  A negative result may occur with improper specimen collection / handling, submission of specimen other than nasopharyngeal swab, presence of viral mutation(s) within the areas targeted by this assay, and inadequate number of viral copies (<250 copies / mL). A negative result must be combined with clinical observations, patient history, and epidemiological information. Fact Sheet for Patients:   BoilerBrush.com.cy Fact Sheet for Healthcare Providers: https://pope.com/ This test is not yet approved or cleared  by the [place] FDA and has been authorized for detection and/or diagnosis of SARS-CoV-2 by FDA under an Emergency Use Authorization (EUA).  This EUA will remain in effect (meaning this test can be used) for the duration of the COVID-19 declaration under Section 564(b)(1) of the Act, 21 U.S.C. section 360bbb-3(b)(1), unless the authorization is terminated or revoked sooner. Performed at [facility],  [address] [place], [place] [address]      Time coordinating discharge: Over 30 minutes  SIGNED:   Tresa Moore, MD  [facility] 12/03/2019, 11:16 AM Pager   If 7PM-7AM, please contact night-coverage

## 2019-12-03 NOTE — Plan of Care (Signed)
  Problem: Education: Goal: Knowledge of General Education information will improve Description: Including pain rating scale, medication(s)/side effects and non-pharmacologic comfort measures Outcome: Adequate for Discharge   Problem: Health Behavior/Discharge Planning: Goal: Ability to manage health-related needs will improve Outcome: Adequate for Discharge   Problem: Clinical Measurements: Goal: Ability to maintain clinical measurements within normal limits will improve Outcome: Adequate for Discharge Goal: Will remain free from infection Outcome: Adequate for Discharge Goal: Diagnostic test results will improve Outcome: Adequate for Discharge Goal: Respiratory complications will improve Outcome: Adequate for Discharge Goal: Cardiovascular complication will be avoided Outcome: Adequate for Discharge   Problem: Activity: Goal: Risk for activity intolerance will decrease Outcome: Adequate for Discharge   Problem: Nutrition: Goal: Adequate nutrition will be maintained Outcome: Adequate for Discharge   Problem: Coping: Goal: Level of anxiety will decrease Outcome: Adequate for Discharge   Problem: Elimination: Goal: Will not experience complications related to bowel motility Outcome: Adequate for Discharge Goal: Will not experience complications related to urinary retention Outcome: Adequate for Discharge   Problem: Pain Managment: Goal: General experience of comfort will improve Outcome: Adequate for Discharge   Problem: Safety: Goal: Ability to remain free from injury will improve Outcome: Adequate for Discharge   Problem: Skin Integrity: Goal: Risk for impaired skin integrity will decrease Outcome: Adequate for Discharge   Problem: Education: Goal: Knowledge of secondary prevention will improve Outcome: Adequate for Discharge Goal: Individualized Educational Video(s) Outcome: Adequate for Discharge   Problem: Coping: Goal: Will identify appropriate support  needs Outcome: Adequate for Discharge   Problem: Nutrition: Goal: Dietary intake will improve Outcome: Adequate for Discharge

## 2019-12-03 NOTE — Discharge Instructions (Signed)
 Hospital Discharge After a Stroke  Being discharged from the hospital after a stroke can feel overwhelming. Many things may be different, and it is normal to feel scared or anxious. Some stroke survivors may be able to return to their homes, and others may need more specialized care on a temporary or permanent basis. Your stroke care team will work with you to develop a discharge plan that is best for you. Ask questions if you do not understand something. Invite a friend or family member to participate in discharge planning. Understanding and following your discharge plan can help to prevent another stroke or other problems. Understanding your medicines After a stroke, your health care provider may prescribe one or more types of medicine. It is important to take medicines exactly as told by your health care provider. Serious harm, such as another stroke, can happen if you are unable to take your medicine exactly as prescribed. Make sure you understand:  What medicine to take.  Why you are taking the medicine.  How and when to take it.  If it can be taken with your other medicines and herbal supplements.  Possible side effects.  When to call your health care provider if you have any side effects.  How you will get and pay for your medicines. Medical assistance programs may be able to help you pay for prescription medicines if you cannot afford them. If you are taking an anticoagulant, be sure to take it exactly as told by your health care provider. This type of medicine can increase the risk of bleeding because it works to prevent blood from clotting. You may need to take certain precautions to prevent bleeding. You should contact your health care provider if you have:  Bleeding or bruising.  A fall or other injury to your head.  Blood in your urine or stool (feces). Planning for home safety  Take steps to prevent falls, such as installing grab bars or using a shower chair. Ask a  friend or family member to get needed things in place before you go home if possible. A therapist can come to your home to make recommendations for safety equipment. Ask your health care provider if you would benefit from this service or from home care. Getting needed equipment Ask your health care provider for a list of any medical equipment and supplies you will need at home. These may include items such as:  Walkers.  Canes.  Wheelchairs.  Hand-strengthening devices.  Special eating utensils. Medical equipment can be rented or purchased, depending on your insurance coverage. Check with your insurance company about what is covered. Keeping follow-up visits After a stroke, you will need to follow up regularly with a health care provider. You may also need rehabilitation, which can include physical therapy, occupational therapy, or speech-language therapy. Keeping these appointments is very important to your recovery after a stroke. Be sure to bring your medicine list and discharge papers with you to your appointments. If you need help to keep track of your schedule, use a calendar or appointment reminder. Preventing another stroke Having a stroke puts you at risk for another stroke in the future. Ask your health care provider what actions you can take to lower the risk. These may include:  Increasing how much you exercise.  Making a healthy eating plan.  Quitting smoking.  Managing other health conditions, such as high blood pressure, high cholesterol, or diabetes.  Limiting alcohol use. Knowing the warning signs of a stroke  Make   sure you understand the signs of a stroke. Before you leave the hospital, you will receive information outlining the stroke warning signs. Share these with your friends and family members. "BE FAST" is an easy way to remember the main warning signs of a stroke:  B - Balance. Signs are dizziness, sudden trouble walking, or loss of balance.  E - Eyes.  Signs are trouble seeing or a sudden change in vision.  F - Face. Signs are sudden weakness or numbness of the face, or the face or eyelid drooping on one side.  A - Arms. Signs are weakness or numbness in an arm. This happens suddenly and usually on one side of the body.  S - Speech. Signs are sudden trouble speaking, slurred speech, or trouble understanding what people say.  T - Time. Time to call emergency services. Write down what time symptoms started. Other signs of stroke may include:  A sudden, severe headache with no known cause.  Nausea or vomiting.  Seizure. These symptoms may represent a serious problem that is an emergency. Do not wait to see if the symptoms will go away. Get medical help right away. Call your local emergency services (911 in the [place] Do not drive yourself to the hospital. Make note of the time that you had your first symptoms. Your emergency responders or emergency room staff will need to know this information. Summary  Being discharged from the hospital after a stroke can feel overwhelming. It is normal to feel scared or anxious.  Make sure you take medicines exactly as told by your health care provider.  Know the warning signs of a stroke, and get help right way if you have any of these symptoms. "BE FAST" is an easy way to remember the main warning signs of a stroke. This information is not intended to replace advice given to you by your health care provider. Make sure you discuss any questions you have with your health care provider. Document Revised: 03/21/2019 Document Reviewed: 10/01/2016 Elsevier Patient Education  2020 [employer]vier Inc.  

## 2019-12-03 NOTE — Progress Notes (Signed)
Patient given discharge instructions with husband at bedside. IV taken out and tele monitor off. Patient verbalized understanding without any questions or concerns. Walker and BSC delivered at bedside.

## 2019-12-04 LAB — FUNGITELL, SERUM: Fungitell Result: <31 pg/mL (ref <80)

## 2020-09-02 IMAGING — CT CT ANGIO HEAD
3 of 10 series · 16 of 47 positions shown · IV contrast (APPLIED)
Comparison: None.

CLINICAL DATA: Stroke on MRI, multiple lung nodules

EXAM:
CT ANGIOGRAPHY HEAD
TECHNIQUE: Multidetector CT imaging of the head was performed using the
standard protocol during bolus administration of intravenous
contrast. Multiplanar CT image reconstructions and MIPs were
obtained to evaluate the vascular anatomy.
CONTRAST:  100mL OMNIPAQUE IOHEXOL 350 MG/ML SOLN

[Series 10: ax thin · axial · 0.36mm/px · z∈[+155,+281]mm · 10 of 146 slices shown]
[im 10/146  brain]
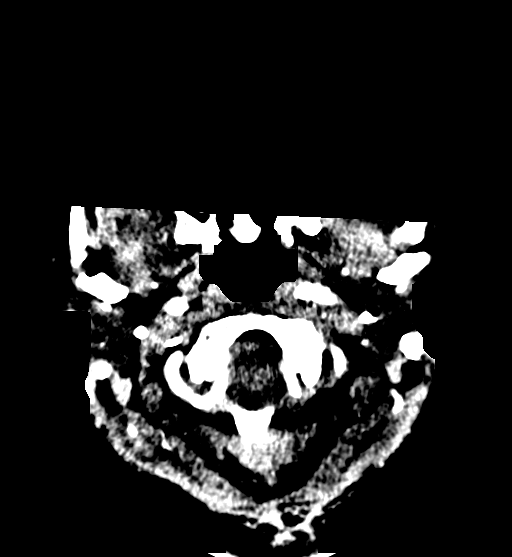
[im 30/146  bone]
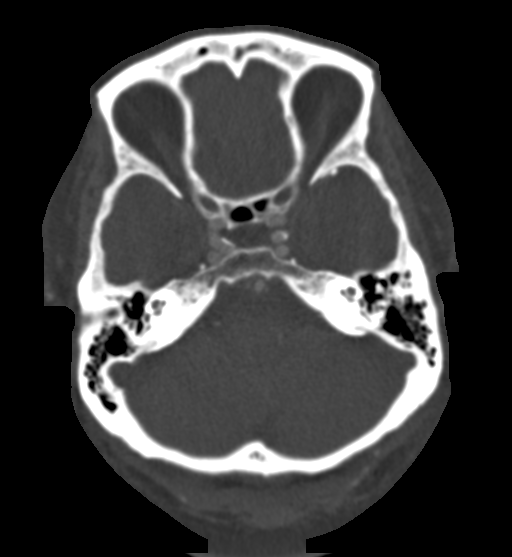
[im 39/146  brain]
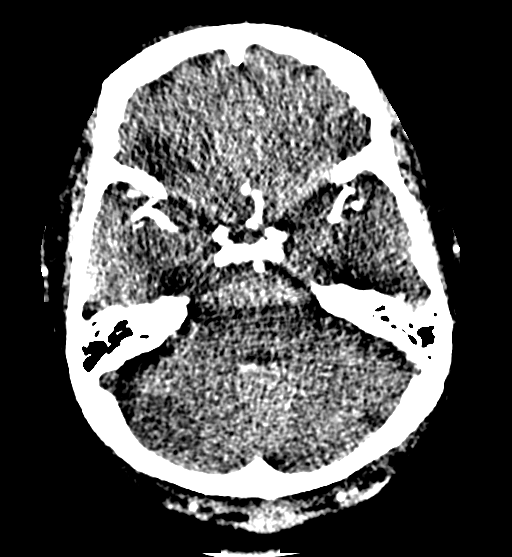
[im 49/146  bone]
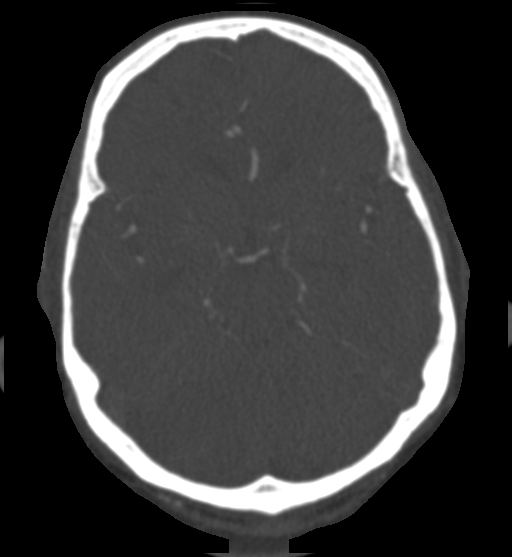
[im 68/146  brain]
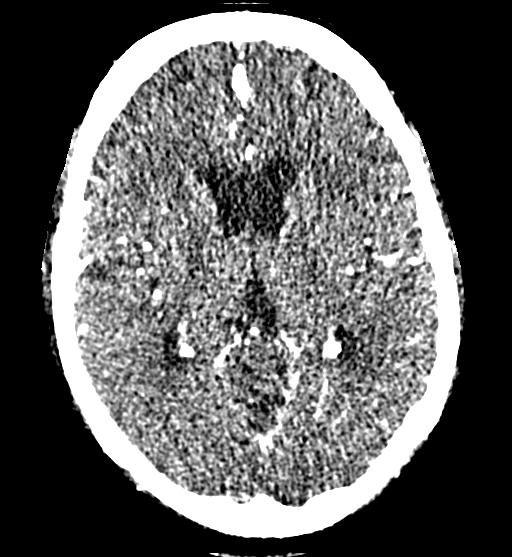
[im 78/146  bone]
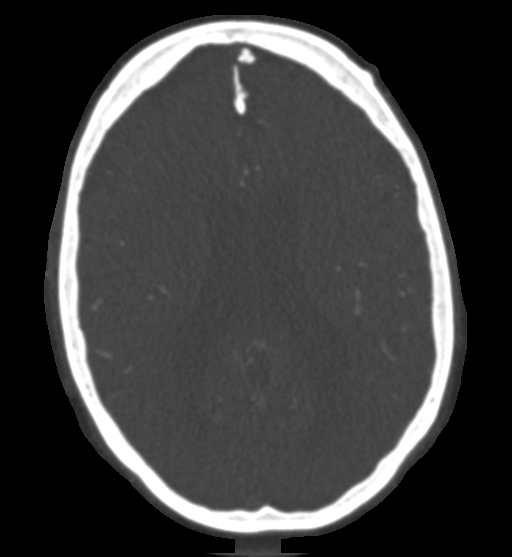
[im 97/146  brain]
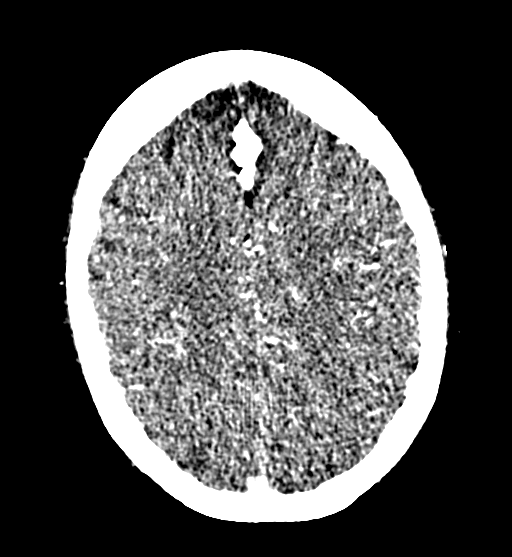
[im 107/146  bone]
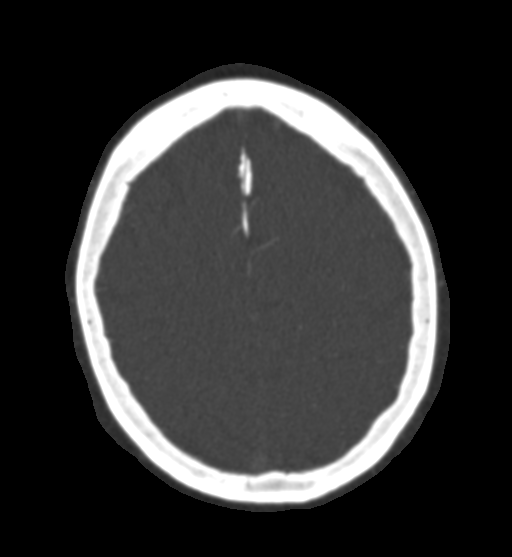
[im 117/146  brain]
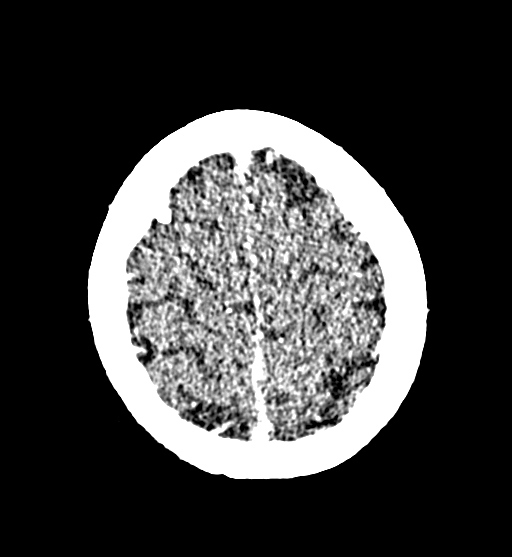
[im 136/146  bone]
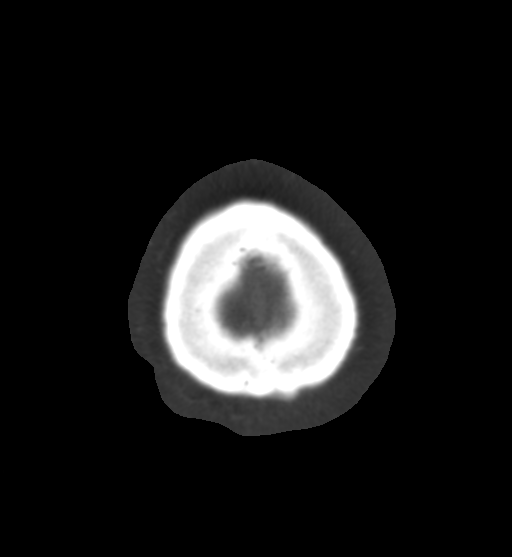

[Series 12: cor thin · coronal · 0.29mm/px · 3 of 198 slices shown]
[im 40/198  brain]
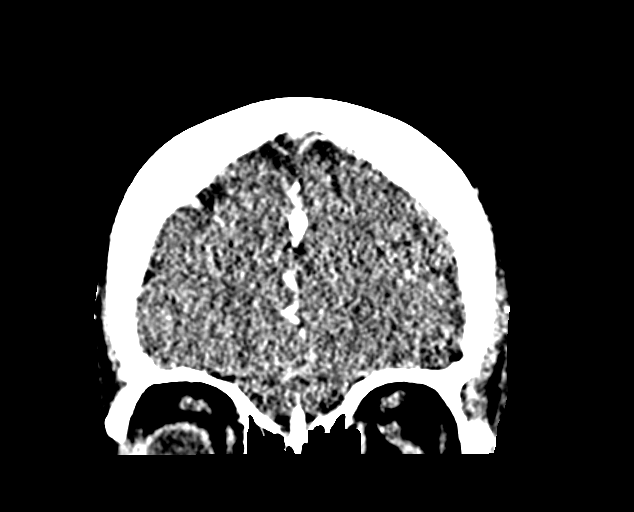
[im 79/198  brain]
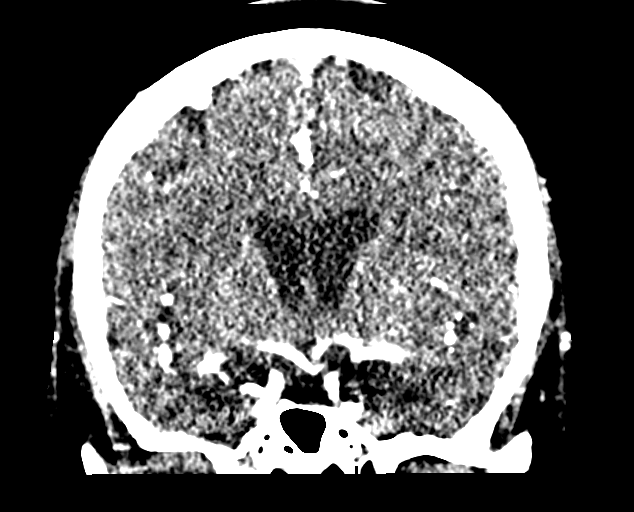
[im 119/198  brain]
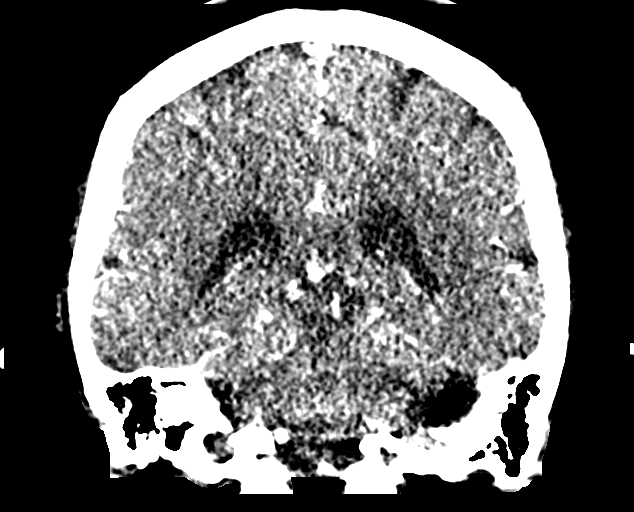

[Series 14: sag thin · sagittal · 0.29mm/px · 3 of 167 slices shown]
[im 43/167  brain]
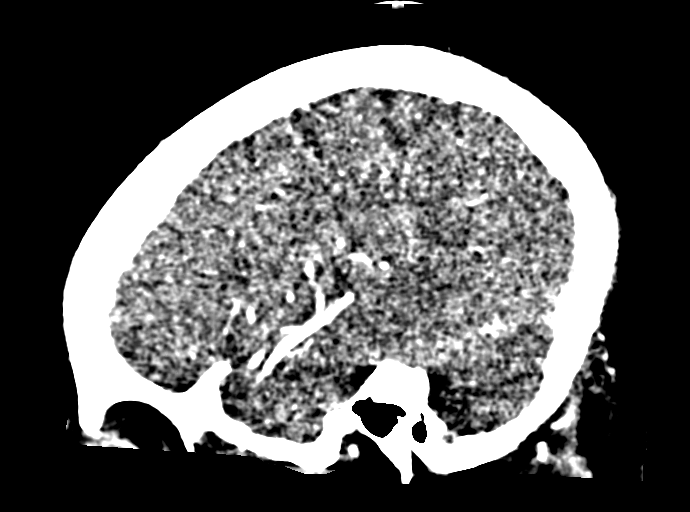
[im 84/167  brain]
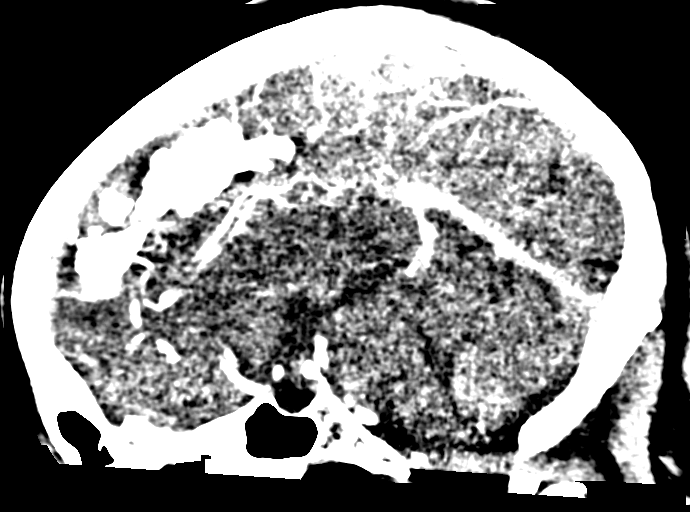
[im 125/167  brain]
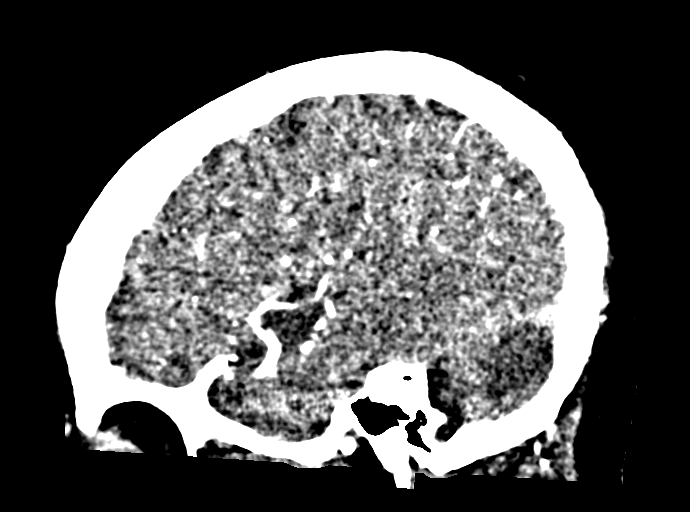

[16 of 47 positions shown; findings below may reference images not displayed]

FINDINGS: CT HEAD

Brain: There is no acute intracranial hemorrhage. More well-defined
hypoattenuation is present in the right cerebellar hemisphere
consistent with evolving acute infarction. The small left cerebellar
infarct is better seen on MRI. There remains no significant mass
effect in the posterior fossa or hydrocephalus.

Chronic inferior right frontal infarct. No new loss of gray-white
differentiation.

Vascular: There is intracranial atherosclerotic calcification at the
skull base.

Skull: Unremarkable.

Sinuses: Visualized portions are aerated.

Orbits: Visualized portions are unremarkable.

CTA HEAD

Anterior circulation: Intracranial internal carotid arteries patent
with calcified plaque causing mild stenosis. Anterior and middle
cerebral arteries are patent.

Posterior circulation: Intracranial vertebral arteries, basilar
artery, and posterior cerebral arteries are patent. Right PICA
origin is not well seen. AICA origins are patent. Right superior
cerebellar artery origin not well seen and there appears to be
diminished flow within this vessel. There are bilateral posterior
communicating arteries. There is a 2 mm inferiorly directed
outpouching at the right PCOM origin. The vessel may rise
eccentrically rather than at the apex of this outpouching and
aneurysm cannot be excluded.

Venous sinuses: Not well evaluated on this study.
IMPRESSION: Evolving acute right cerebellar infarct. Small left cerebellar
infarct better seen on MRI. There remains no significant mass effect
or hydrocephalus. No acute intracranial hemorrhage.

Right PICA and SCA origins are not well seen.

2 mm infundibulum or aneurysm at the right posterior communicating
artery origin.

## 2022-09-29 LAB — HM DEXA SCAN

## 2023-05-17 ENCOUNTER — Encounter: Payer: Self-pay | Admitting: Internal Medicine

## 2023-05-17 ENCOUNTER — Ambulatory Visit: Payer: Medicare HMO | Admitting: Internal Medicine

## 2023-05-17 VITALS — BP 136/82 | HR 86 | Temp 98.3°F | Resp 16 | Ht 65.0 in | Wt 246.0 lb

## 2023-05-17 DIAGNOSIS — M810 Age-related osteoporosis without current pathological fracture: Secondary | ICD-10-CM

## 2023-05-17 DIAGNOSIS — E538 Deficiency of other specified B group vitamins: Secondary | ICD-10-CM | POA: Diagnosis not present

## 2023-05-17 DIAGNOSIS — E042 Nontoxic multinodular goiter: Secondary | ICD-10-CM | POA: Diagnosis not present

## 2023-05-17 DIAGNOSIS — Z23 Encounter for immunization: Secondary | ICD-10-CM | POA: Diagnosis not present

## 2023-05-17 DIAGNOSIS — I779 Disorder of arteries and arterioles, unspecified: Secondary | ICD-10-CM

## 2023-05-17 DIAGNOSIS — E559 Vitamin D deficiency, unspecified: Secondary | ICD-10-CM

## 2023-05-17 DIAGNOSIS — I700 Atherosclerosis of aorta: Secondary | ICD-10-CM | POA: Diagnosis not present

## 2023-05-17 DIAGNOSIS — I10 Essential (primary) hypertension: Secondary | ICD-10-CM | POA: Diagnosis not present

## 2023-05-17 DIAGNOSIS — E785 Hyperlipidemia, unspecified: Secondary | ICD-10-CM

## 2023-05-17 DIAGNOSIS — I4891 Unspecified atrial fibrillation: Secondary | ICD-10-CM

## 2023-05-17 DIAGNOSIS — E119 Type 2 diabetes mellitus without complications: Secondary | ICD-10-CM

## 2023-05-17 DIAGNOSIS — Z8673 Personal history of transient ischemic attack (TIA), and cerebral infarction without residual deficits: Secondary | ICD-10-CM | POA: Diagnosis not present

## 2023-05-17 NOTE — Progress Notes (Signed)
Medication Samples have been provided to the patient.  Drug name: Eliquis       Strength: 5mg         Qty: 4 boxes     The patient has been instructed regarding the correct time, dose, and frequency of taking this medication, including desired effects and most common side effects.   Kristie Cowman 4:24 PM 05/17/2023

## 2023-05-17 NOTE — Progress Notes (Signed)
Subjective:    Patient ID: Tanya Aguirre, female    DOB: [birth year], 82 y.o.   MRN: [id number]  Patient here for  Chief Complaint  Patient presents with   Establish Care         HPI Here to establish care. Has documented history of hypertension, hypercholesterolemia, diabetes and afib (on sotalol and cardizem). CVA in 2021. Has been followed by Dr Kerrie Pleasure. Regarding her diabetes, she is currently on toujeo off metformin. Blood sugars averaging 150-160. Low carb diet and exercise.  Breathing stable. In reviewing records, previous CT (chest) - multiple subcentimeter nodules (2021). Quit smoking. No abdominal pain reported. Does report some fatigue.  Some residual fine motor deficits since CVA.   Past Medical History:  Diagnosis Date   A-fib (HCC)    Allergy    Arthritis    Asthma    Diabetes mellitus without complication (HCC)    Hyperlipidemia    Hypertension    Stroke [facility])    Past Surgical History:  Procedure Laterality Date   BREAST LUMPECTOMY Right    Family History  Problem Relation Age of Onset   Stroke Mother    Hypertension Mother    Diabetes Mother    Heart attack Father    Heart attack Sister    Kidney disease Sister    Hypertension Sister    Diabetes Brother    Hypertension Brother    Diabetes Mellitus II Brother    Stroke Sister    Hypertension Sister    Diabetes Sister    Hypertension Brother    Diabetes Brother    Hypertension Sister    Diabetes Sister    Arthritis Sister    Social History   Socioeconomic History   Marital status: Married    Spouse name: Not on file   Number of children: Not on file   Years of education: Not on file   Highest education level: Not on file  Occupational History   Not on file  Tobacco Use   Smoking status: Never   Smokeless tobacco: Never  Substance and Sexual Activity   Alcohol use: Never   Drug use: Never   Sexual activity: Not on file  Other Topics Concern   Not on file  Social History Narrative    Not on file   Social Determinants of Health   Financial Resource Strain: Not on file  Food Insecurity: Not on file  Transportation Needs: Not on file  Physical Activity: Not on file  Stress: Not on file  Social Connections: Not on file     Review of Systems  Constitutional:  Positive for fatigue. Negative for appetite change and unexpected weight change.  HENT:  Negative for congestion and sinus pressure.   Respiratory:  Negative for cough and chest tightness.        Breathing stable.   Cardiovascular:  Negative for chest pain and palpitations.  Gastrointestinal:  Negative for abdominal pain, diarrhea, nausea and vomiting.  Genitourinary:  Negative for difficulty urinating and dysuria.  Musculoskeletal:  Negative for joint swelling and myalgias.  Skin:  Negative for color change and rash.  Neurological:  Negative for dizziness and headaches.  Psychiatric/Behavioral:  Negative for agitation and dysphoric mood.        Objective:     BP 136/82   Pulse 86   Temp 98.3 F (36.8 C)   Resp 16   Ht 5\' 5"  (1.651 m)   Wt 246 lb (111.6 kg)   SpO2 98%  BMI 40.94 kg/m  Wt Readings from Last 3 Encounters:  05/17/23 246 lb (111.6 kg)  12/03/19 235 lb 11.2 oz (106.9 kg)    Physical Exam Vitals reviewed.  Constitutional:      General: She is not in acute distress.    Appearance: Normal appearance.  HENT:     Head: Normocephalic and atraumatic.     Right Ear: External ear normal.     Left Ear: External ear normal.  Eyes:     General: No scleral icterus.       Right eye: No discharge.        Left eye: No discharge.     Conjunctiva/sclera: Conjunctivae normal.  Neck:     Thyroid: No thyromegaly.  Cardiovascular:     Rate and Rhythm: Normal rate and regular rhythm.  Pulmonary:     Effort: No respiratory distress.     Breath sounds: Normal breath sounds. No wheezing.  Abdominal:     General: Bowel sounds are normal.     Palpations: Abdomen is soft.     Tenderness:  There is no abdominal tenderness.  Musculoskeletal:        General: No swelling or tenderness.     Cervical back: Neck supple. No tenderness.  Lymphadenopathy:     Cervical: No cervical adenopathy.  Skin:    Findings: No erythema or rash.  Neurological:     Mental Status: She is alert.  Psychiatric:        Mood and Affect: Mood normal.        Behavior: Behavior normal.      Outpatient Encounter Medications as of 05/17/2023  Medication Sig   Accu-Chek FastClix Lancets MISC Apply 1 each topically 2 (two) times daily.   DROPLET PEN NEEDLES 31G X 5 MM MISC    TOUJEO SOLOSTAR 300 UNIT/ML Solostar Pen Inject 20 Units into the skin daily.   ACCU-CHEK AVIVA PLUS test strip 1 each 2 (two) times daily.   apixaban (ELIQUIS) 5 MG TABS tablet Take 1 tablet (5 mg total) by mouth 2 (two) times daily.   diltiazem (CARDIZEM) 120 MG tablet Take 120 mg by mouth at bedtime.   furosemide (LASIX) 40 MG tablet Take 1 tablet (40 mg total) by mouth daily.   potassium chloride SA (KLOR-CON) 20 MEQ tablet Take 20 mEq by mouth daily.   simvastatin (ZOCOR) 20 MG tablet Take 20 mg by mouth at bedtime.   sotalol (BETAPACE) 80 MG tablet Take 80 mg by mouth at bedtime.   Vitamin D, Ergocalciferol, (DRISDOL) 1.25 MG (50000 UNIT) CAPS capsule Take 50,000 Units by mouth once a week.   [DISCONTINUED] amLODipine (NORVASC) 5 MG tablet Take 1 tablet (5 mg total) by mouth daily.   [DISCONTINUED] insulin glargine, 2 Unit Dial, (TOUJEO MAX SOLOSTAR) 300 UNIT/ML Solostar Pen Inject 12-20 Units into the skin daily.   [DISCONTINUED] metFORMIN (GLUCOPHAGE) 1000 MG tablet Take 1,000 mg by mouth 2 (two) times daily.   No facility-administered encounter medications on file as of 05/17/2023.     Lab Results  Component Value Date   WBC 7.0 12/03/2019   HGB 11.6 (L) 12/03/2019   HCT 34.8 (L) 12/03/2019   PLT 214 12/03/2019   GLUCOSE 124 (H) 12/03/2019   CHOL 142 11/30/2019   TRIG 67 11/30/2019   HDL 61 11/30/2019   LDLCALC  68 11/30/2019   NA 138 12/03/2019   K 3.3 (L) 12/03/2019   CL 103 12/03/2019   CREATININE 0.98 12/03/2019   BUN  16 12/03/2019   CO2 26 12/03/2019   HGBA1C 6.9 (H) 11/30/2019    CT ANGIO HEAD W OR WO CONTRAST  Result Date: 11/30/2019 CLINICAL DATA:  Stroke on MRI, multiple lung nodules EXAM: CT ANGIOGRAPHY HEAD TECHNIQUE: Multidetector CT imaging of the head was performed using the standard protocol during bolus administration of intravenous contrast. Multiplanar CT image reconstructions and MIPs were obtained to evaluate the vascular anatomy. CONTRAST:  OMNIPAQUE IOHEXOL 350 MG/ML SOLN COMPARISON:  None. FINDINGS: CT HEAD Brain: There is no acute intracranial hemorrhage. More well-defined hypoattenuation is present in the right cerebellar hemisphere consistent with evolving acute infarction. The small left cerebellar infarct is better seen on MRI. There remains no significant mass effect in the posterior fossa or hydrocephalus. Chronic inferior right frontal infarct. No new loss of gray-white differentiation. Vascular: There is intracranial atherosclerotic calcification at the skull base. Skull: Unremarkable. Sinuses: Visualized portions are aerated. Orbits: Visualized portions are unremarkable. CTA HEAD Anterior circulation: Intracranial internal carotid arteries patent with calcified plaque causing mild stenosis. Anterior and middle cerebral arteries are patent. Posterior circulation: Intracranial vertebral arteries, basilar artery, and posterior cerebral arteries are patent. Right PICA origin is not well seen. AICA origins are patent. Right superior cerebellar artery origin not well seen and there appears to be diminished flow within this vessel. There are bilateral posterior communicating arteries. There is a 2 mm inferiorly directed outpouching at the right PCOM origin. The vessel may rise eccentrically rather than at the apex of this outpouching and aneurysm cannot be excluded. Venous  sinuses: Not well evaluated on this study. IMPRESSION: Evolving acute right cerebellar infarct. Small left cerebellar infarct better seen on MRI. There remains no significant mass effect or hydrocephalus. No acute intracranial hemorrhage. Right PICA and SCA origins are not well seen. 2 mm infundibulum or aneurysm at the right posterior communicating artery origin. Electronically Signed   By: Guadlupe Spanish M.D.   On: 11/30/2019 15:11   CT CHEST W CONTRAST  Result Date: 11/30/2019 CLINICAL DATA:  Nodular densities on recent CT abdomen examination EXAM: CT CHEST WITH CONTRAST TECHNIQUE: Multidetector CT imaging of the chest was performed during intravenous contrast administration. CONTRAST:  OMNIPAQUE IOHEXOL 350 MG/ML SOLN COMPARISON:  100 mL Omnipaque 350. FINDINGS: Cardiovascular: Atherosclerotic calcifications of the thoracic aorta are noted. No aneurysmal dilatation or dissection is seen. Truncus anomaly is noted. Pulmonary artery as visualized is within normal limits. No significant coronary calcifications are noted. Heart is at the upper limits of normal in size. Mediastinum/Nodes: Thoracic inlet is within normal limits. No hilar or mediastinal adenopathy is noted. The esophagus is within normal limits. Lungs/Pleura: Lungs are well aerated bilaterally. Multiple bilateral subcentimeter nodules are noted throughout both lungs again suspicious for metastatic disease. No dominant nodule is seen. No sizable effusion is noted. Upper Abdomen: Upper abdomen demonstrates cholelithiasis without complicating factors. Normal excretion of contrast is noted from the kidneys. No other focal abnormality is seen. Musculoskeletal: Degenerative changes of the thoracic spine are noted. No lytic or sclerotic bony lesion is seen. IMPRESSION: Multiple subcentimeter sub solid nodules are seen highly suspicious for metastatic disease. Tissue sampling may be helpful. Cholelithiasis without complicating factors. Aortic  Atherosclerosis (ICD10-I70.0). Electronically Signed   By: Alcide Clever M.D.   On: 11/30/2019 15:09   ECHOCARDIOGRAM COMPLETE  Result Date: 11/30/2019    ECHOCARDIOGRAM REPORT   Patient Name:   [facility] Date of Exam: 11/30/2019 Medical Rec #:  [id number]  Height:       65.0 in Accession #:    [id number]        Weight:       238.4 lb Date of Birth:  1941/02/14          BSA:          2.131 m Patient Age:    79 years          BP:           139/53 mmHg Patient Gender: F                 HR:           68 bpm. Exam Location:  [facility] Procedure: 2D Echo, Color Doppler and Cardiac Doppler Indications:     I163.9 Stroke  History:         Patient has no prior history of Echocardiogram examinations.                  Arrythmias:Atrial Fibrillation; Risk Factors:Hypertension and                  Diabetes.  Sonographer:     Humphrey Rolls RDCS (AE) Referring Phys:  [id number] Brien Few NIU Diagnosing Phys: Julien Nordmann MD  Sonographer Comments: Suboptimal subcostal window. IMPRESSIONS  1. Left ventricular ejection fraction, by estimation, is 60 to 65%. The left ventricle has normal function. The left ventricle has no regional wall motion abnormalities. Left ventricular diastolic parameters are indeterminate.  2. Right ventricular systolic function is normal. The right ventricular size is normal.  3. Left atrial size was mildly dilated.  4. The mitral valve is normal in structure. Mild mitral valve regurgitation.  5. Rhythm is atrial fibrillation FINDINGS  Left Ventricle: Left ventricular ejection fraction, by estimation, is 60 to 65%. The left ventricle has normal function. The left ventricle has no regional wall motion abnormalities. The left ventricular internal cavity size was normal in size. There is  no left ventricular hypertrophy. Left ventricular diastolic parameters are indeterminate. Right Ventricle: The right ventricular size is normal. No increase in right ventricular wall thickness. Right ventricular systolic  function is normal. Left Atrium: Left atrial size was mildly dilated. Right Atrium: Right atrial size was normal in size. Pericardium: There is no evidence of pericardial effusion. Mitral Valve: The mitral valve is normal in structure. Normal mobility of the mitral valve leaflets. Mild mitral valve regurgitation. No evidence of mitral valve stenosis. MV peak gradient, 5.3 mmHg. The mean mitral valve gradient is 1.0 mmHg. Tricuspid Valve: The tricuspid valve is normal in structure. Tricuspid valve regurgitation is not demonstrated. No evidence of tricuspid stenosis. Aortic Valve: The aortic valve is normal in structure. Aortic valve regurgitation is not visualized. No aortic stenosis is present. Aortic valve mean gradient measures 2.0 mmHg. Aortic valve peak gradient measures 4.7 mmHg. Aortic valve area, by VTI measures 3.13 cm. Pulmonic Valve: The pulmonic valve was normal in structure. Pulmonic valve regurgitation is not visualized. No evidence of pulmonic stenosis. Aorta: The aortic root is normal in size and structure. Venous: The inferior vena cava is normal in size with greater than 50% respiratory variability, suggesting right atrial pressure of 3 mmHg. IAS/Shunts: No atrial level shunt detected by color flow Doppler.  LEFT VENTRICLE PLAX 2D LVIDd:         4.35 cm  Diastology LVIDs:         2.39 cm  LV e' lateral:   11.50 cm/s LV PW:  0.92 cm  LV E/e' lateral: 9.6 LV IVS:        0.78 cm  LV e' medial:    8.05 cm/s LVOT diam:     2.20 cm  LV E/e' medial:  13.7 LV SV:         65 LV SV Index:   30 LVOT Area:     3.80 cm  LEFT ATRIUM             Index LA diam:        3.80 cm 1.78 cm/m LA Vol (A2C):   51.0 ml 23.93 ml/m LA Vol (A4C):   70.6 ml 33.12 ml/m LA Biplane Vol: 62.8 ml 29.46 ml/m  AORTIC VALVE                   PULMONIC VALVE AV Area (Vmax):    2.91 cm    PV Vmax:       0.73 m/s AV Area (Vmean):   3.12 cm    PV Vmean:      50.500 cm/s AV Area (VTI):     3.13 cm    PV VTI:        0.122 m AV  Vmax:           108.00 cm/s PV Peak grad:  2.1 mmHg AV Vmean:          69.100 cm/s PV Mean grad:  1.0 mmHg AV VTI:            0.208 m AV Peak Grad:      4.7 mmHg AV Mean Grad:      2.0 mmHg LVOT Vmax:         82.80 cm/s LVOT Vmean:        56.700 cm/s LVOT VTI:          0.171 m LVOT/AV VTI ratio: 0.82  AORTA Ao Root diam: 2.70 cm MITRAL VALVE MV Area (PHT): 4.59 cm     SHUNTS MV Peak grad:  5.3 mmHg     Systemic VTI:  0.17 m MV Mean grad:  1.0 mmHg     Systemic Diam: 2.20 cm MV Vmax:       1.15 m/s MV Vmean:      51.2 cm/s MV Decel Time: 165 msec MV E velocity: 110.33 cm/s MV A velocity: 49.50 cm/s MV E/A ratio:  2.23 Julien Nordmann MD Electronically signed by Julien Nordmann MD Signature Date/Time: 11/30/2019/1:28:47 PM    Final        Assessment & Plan:  Aortic atherosclerosis (HCC) Assessment & Plan: Continue simvastatin.    Primary hypertension Assessment & Plan: Currently on diltiazem and betapace. Follow pressures.  Follow metabolic panel.    Diabetes mellitus without complication (HCC) Assessment & Plan: On toujeo.  Off metformin.  Low carb diet and exercise.  Follow met b and A1c.    Atrial fibrillation, unspecified type [facility]) Assessment & Plan: Currently on eliquis and betapace and diltiazem.    Need for influenza vaccination -     Flu Vaccine Trivalent High Dose (Fluad)  History of CVA (cerebrovascular accident) Assessment & Plan: History of cerebral infarction posterior circulation brain stem. On eliquis.     Multinodular goiter Assessment & Plan: S/p FNA - negative for malignancy.  Follow tsh.    B12 deficiency Assessment & Plan: Recheck B12 level.    Vitamin D deficiency Assessment & Plan: Documented history of vitamin D def.  Follow vitamin D level.    Osteoporosis without  current pathological fracture, unspecified osteoporosis type Assessment & Plan: Documented history of osteoporosis. In review, previously on fosamax. Not taking now.  Calcium and  vitamin D.    Bilateral carotid artery disease, unspecified type (HCC) Assessment & Plan: Mild stenosis of the right proximal internal carotid artery (<50% stenosis in the range of 16-49%). Mild stenosis of the left ICA (<50% in the range of 16-49%) - 07/22/22.  Continues on simvastatin.    Hyperlipidemia, unspecified hyperlipidemia type Assessment & Plan: On simvastatin.  Low cholesterol diet and exercise.  Follow lipid panel and liver function tests.       [place], MD

## 2023-05-22 ENCOUNTER — Encounter: Payer: Self-pay | Admitting: Internal Medicine

## 2023-05-22 DIAGNOSIS — E559 Vitamin D deficiency, unspecified: Secondary | ICD-10-CM | POA: Insufficient documentation

## 2023-05-22 DIAGNOSIS — I779 Disorder of arteries and arterioles, unspecified: Secondary | ICD-10-CM | POA: Insufficient documentation

## 2023-05-22 DIAGNOSIS — M810 Age-related osteoporosis without current pathological fracture: Secondary | ICD-10-CM | POA: Insufficient documentation

## 2023-05-22 DIAGNOSIS — E538 Deficiency of other specified B group vitamins: Secondary | ICD-10-CM | POA: Insufficient documentation

## 2023-05-22 DIAGNOSIS — E042 Nontoxic multinodular goiter: Secondary | ICD-10-CM | POA: Insufficient documentation

## 2023-05-22 NOTE — Assessment & Plan Note (Signed)
Documented history of osteoporosis. In review, previously on fosamax. Not taking now.  Calcium and vitamin D.

## 2023-05-22 NOTE — Assessment & Plan Note (Signed)
Currently on eliquis and betapace and diltiazem.

## 2023-05-22 NOTE — Assessment & Plan Note (Addendum)
Mild stenosis of the right proximal internal carotid artery (<50% stenosis in the range of 16-49%). Mild stenosis of the left ICA (<50% in the range of 16-49%) - 07/22/22.  Continues on simvastatin.

## 2023-05-22 NOTE — Assessment & Plan Note (Signed)
Continue simvastatin. 

## 2023-05-22 NOTE — Assessment & Plan Note (Signed)
On simvastatin.  Low cholesterol diet and exercise.  Follow lipid panel and liver function tests.   

## 2023-05-22 NOTE — Assessment & Plan Note (Signed)
Currently on diltiazem and betapace. Follow pressures.  Follow metabolic panel.

## 2023-05-22 NOTE — Assessment & Plan Note (Signed)
S/p FNA - negative for malignancy.  Follow tsh.

## 2023-05-22 NOTE — Assessment & Plan Note (Addendum)
History of cerebral infarction posterior circulation brain stem. On eliquis.

## 2023-05-22 NOTE — Assessment & Plan Note (Signed)
Recheck B12 level 

## 2023-05-22 NOTE — Assessment & Plan Note (Signed)
Documented history of vitamin D def.  Follow vitamin D level.

## 2023-05-22 NOTE — Assessment & Plan Note (Signed)
On toujeo.  Off metformin.  Low carb diet and exercise.  Follow met b and A1c.

## 2023-05-26 ENCOUNTER — Telehealth: Payer: Self-pay | Admitting: Internal Medicine

## 2023-05-26 DIAGNOSIS — I10 Essential (primary) hypertension: Secondary | ICD-10-CM

## 2023-05-26 DIAGNOSIS — E785 Hyperlipidemia, unspecified: Secondary | ICD-10-CM

## 2023-05-26 DIAGNOSIS — E119 Type 2 diabetes mellitus without complications: Secondary | ICD-10-CM

## 2023-05-26 NOTE — Telephone Encounter (Signed)
Labs ordered.

## 2023-05-26 NOTE — Telephone Encounter (Signed)
Patient need lab orders.

## 2023-05-31 ENCOUNTER — Other Ambulatory Visit (INDEPENDENT_AMBULATORY_CARE_PROVIDER_SITE_OTHER): Payer: Medicare HMO

## 2023-05-31 DIAGNOSIS — I10 Essential (primary) hypertension: Secondary | ICD-10-CM | POA: Diagnosis not present

## 2023-05-31 DIAGNOSIS — E119 Type 2 diabetes mellitus without complications: Secondary | ICD-10-CM

## 2023-05-31 DIAGNOSIS — E785 Hyperlipidemia, unspecified: Secondary | ICD-10-CM

## 2023-05-31 LAB — CBC WITH DIFFERENTIAL/PLATELET
Basophils Absolute: 0.1 10*3/uL (ref 0.0–0.1)
Basophils Relative: 1 % (ref 0.0–3.0)
Eosinophils Absolute: 0.4 10*3/uL (ref 0.0–0.7)
Eosinophils Relative: 7.3 % — ABNORMAL HIGH (ref 0.0–5.0)
HCT: 40.5 % (ref 36.0–46.0)
Hemoglobin: 13.1 g/dL (ref 12.0–15.0)
Lymphocytes Relative: 29 % (ref 12.0–46.0)
Lymphs Abs: 1.8 10*3/uL (ref 0.7–4.0)
MCHC: 32.4 g/dL (ref 30.0–36.0)
MCV: 95.9 fL (ref 78.0–100.0)
Monocytes Absolute: 0.8 10*3/uL (ref 0.1–1.0)
Monocytes Relative: 13.2 % — ABNORMAL HIGH (ref 3.0–12.0)
Neutro Abs: 3 10*3/uL (ref 1.4–7.7)
Neutrophils Relative %: 49.5 % (ref 43.0–77.0)
Platelets: 209 10*3/uL (ref 150.0–400.0)
RBC: 4.22 Mil/uL (ref 3.87–5.11)
RDW: 13.3 % (ref 11.5–15.5)
WBC: 6 10*3/uL (ref 4.0–10.5)

## 2023-05-31 LAB — HEPATIC FUNCTION PANEL
ALT: 7 U/L (ref 0–35)
AST: 11 U/L (ref 0–37)
Albumin: 4 g/dL (ref 3.5–5.2)
Alkaline Phosphatase: 82 U/L (ref 39–117)
Bilirubin, Direct: 0.1 mg/dL (ref 0.0–0.3)
Total Bilirubin: 0.6 mg/dL (ref 0.2–1.2)
Total Protein: 7.3 g/dL (ref 6.0–8.3)

## 2023-05-31 LAB — BASIC METABOLIC PANEL
BUN: 24 mg/dL — ABNORMAL HIGH (ref 6–23)
CO2: 29 meq/L (ref 19–32)
Calcium: 9.1 mg/dL (ref 8.4–10.5)
Chloride: 101 meq/L (ref 96–112)
Creatinine, Ser: 1.27 mg/dL — ABNORMAL HIGH (ref 0.40–1.20)
GFR: 39.63 mL/min — ABNORMAL LOW (ref >60.00)
Glucose, Bld: 213 mg/dL — ABNORMAL HIGH (ref 70–99)
Potassium: 4.4 meq/L (ref 3.5–5.1)
Sodium: 138 meq/L (ref 135–145)

## 2023-05-31 LAB — HEMOGLOBIN A1C: Hgb A1c MFr Bld: 10.7 % — ABNORMAL HIGH (ref 4.6–6.5)

## 2023-05-31 LAB — LIPID PANEL
Cholesterol: 174 mg/dL (ref 0–200)
HDL: 48.5 mg/dL (ref >39.00)
LDL Cholesterol: 101 mg/dL — ABNORMAL HIGH (ref 0–99)
NonHDL: 125.47
Total CHOL/HDL Ratio: 4
Triglycerides: 120 mg/dL (ref 0.0–149.0)
VLDL: 24 mg/dL (ref 0.0–40.0)

## 2023-05-31 LAB — TSH: TSH: 2.29 u[IU]/mL (ref 0.35–5.50)

## 2023-06-17 ENCOUNTER — Other Ambulatory Visit: Payer: Self-pay | Admitting: Internal Medicine

## 2023-06-17 DIAGNOSIS — E1165 Type 2 diabetes mellitus with hyperglycemia: Secondary | ICD-10-CM

## 2023-06-17 NOTE — Progress Notes (Signed)
Order placed for endocrinology referral.  

## 2023-06-20 ENCOUNTER — Telehealth: Payer: Self-pay

## 2023-06-20 NOTE — Telephone Encounter (Signed)
-----   Message from [place] sent at 06/18/2023 10:55 AM EST ----- I have placed the order for the referral.  Also, I am ok with pulling samples of eliquis.  Also, see if agreeable for referral for pt assistance for eliquis.  Agree with sending in sugar readings in the next 1-2 weeks - so I can help to adjust her medication for better sugar control.

## 2023-06-21 NOTE — Telephone Encounter (Signed)
Patient states she is returning call from Rita Ohara, LPN.  I read message from Dr. Dale Fieldon to patient.  Patient states she will come by to pick up the eliquis samples today.  Patient states she will bring in her surgar readings next week.

## 2023-06-21 NOTE — Telephone Encounter (Signed)
Eliquis samples pulled. Will place up front for pick up.

## 2023-06-21 NOTE — Telephone Encounter (Signed)
Medication Samples have been provided to the patient.  Drug name: Eliquis       Strength: 5 mg        Qty: 2 boxes  LOT: [id number]  Exp.Date: 07/11/2024  Dosing instructions: 1 TABLET TWICE DAILY  SAMPLES PLACED UP FRONT FOR PICK UP.  Merlyn Lot 10:54 AM 06/21/2023

## 2023-07-15 ENCOUNTER — Other Ambulatory Visit: Payer: Self-pay | Admitting: Internal Medicine

## 2023-07-15 ENCOUNTER — Telehealth: Payer: Self-pay

## 2023-07-15 ENCOUNTER — Other Ambulatory Visit: Payer: Self-pay

## 2023-07-15 MED ORDER — APIXABAN 5 MG PO TABS: 5.0000 mg | ORAL_TABLET | Freq: Two times a day (BID) | ORAL | 2 refills | Status: AC

## 2023-07-15 NOTE — Telephone Encounter (Signed)
 Copied from CRM [id number]. Topic: Clinical - Medication Refill >> Jul 15, 2023  8:31 AM Isabell A wrote: Most Recent Primary Care Visit:  Provider: [facility]  Department: [facility]  Visit Type: LAB  Date: 05/31/2023  Medication: ***  Has the patient contacted their pharmacy?  (Agent: If no, request that the patient contact the pharmacy for the refill. If patient does not wish to contact the pharmacy document the reason why and proceed with request.) (Agent: If yes, when and what did the pharmacy advise?)  Is this the correct pharmacy for this prescription?  If no, delete pharmacy and type the correct one.  This is the patient's preferred pharmacy:  [id number] - [place],  - [address] [place] [address] Phone: [phone] Fax: [phone]   Has the prescription been filled recently?   Is the patient out of the medication?   Has the patient been seen for an appointment in the last year OR does the patient have an upcoming appointment?   Can we respond through MyChart?   Agent: Please be advised that Rx refills may take up to 3 business days. We ask that you follow-up with your pharmacy.

## 2023-07-15 NOTE — Telephone Encounter (Signed)
 Medication refilled

## 2023-07-15 NOTE — Telephone Encounter (Signed)
 Copied from CRM [id number]. Topic: Clinical - Medication Refill >> Jul 15, 2023  8:31 AM Isabell A wrote: Most Recent Primary Care Visit:  Provider: [facility]  Department: [facility]  Visit Type: LAB  Date: 05/31/2023  Medication: apixaban  (ELIQUIS ) 5 MG TABS tablet   Has the patient contacted their pharmacy? No (Agent: If no, request that the patient contact the pharmacy for the refill. If patient does not wish to contact the pharmacy document the reason why and proceed with request.) (Agent: If yes, when and what did the pharmacy advise?)  Is this the correct pharmacy for this prescription? Yes If no, delete pharmacy and type the correct one.  This is the patient's preferred pharmacy:  [id number] - [place], [place] - [address] [place] [address] Phone: [phone] Fax: [phone]   Has the prescription been filled recently? Yes  Is the patient out of the medication? No, will be out tomorrow.   Has the patient been seen for an appointment in the last year OR does the patient have an upcoming appointment? Yes  Can we respond through MyChart? No  Agent: Please be advised that Rx refills may take up to 3 business days. We ask that you follow-up with your pharmacy.

## 2023-07-15 NOTE — Telephone Encounter (Signed)
 Rx already sent in today for eliquis.  Duplicate.

## 2023-07-18 ENCOUNTER — Encounter: Payer: Self-pay | Admitting: Internal Medicine

## 2023-07-18 ENCOUNTER — Ambulatory Visit (INDEPENDENT_AMBULATORY_CARE_PROVIDER_SITE_OTHER): Payer: Medicare HMO | Admitting: Internal Medicine

## 2023-07-18 VITALS — BP 126/72 | HR 90 | Temp 98.0°F | Resp 16 | Ht 65.0 in | Wt 250.2 lb

## 2023-07-18 DIAGNOSIS — E042 Nontoxic multinodular goiter: Secondary | ICD-10-CM

## 2023-07-18 DIAGNOSIS — E538 Deficiency of other specified B group vitamins: Secondary | ICD-10-CM | POA: Diagnosis not present

## 2023-07-18 DIAGNOSIS — Z8673 Personal history of transient ischemic attack (TIA), and cerebral infarction without residual deficits: Secondary | ICD-10-CM

## 2023-07-18 DIAGNOSIS — I700 Atherosclerosis of aorta: Secondary | ICD-10-CM

## 2023-07-18 DIAGNOSIS — I4891 Unspecified atrial fibrillation: Secondary | ICD-10-CM

## 2023-07-18 DIAGNOSIS — E1165 Type 2 diabetes mellitus with hyperglycemia: Secondary | ICD-10-CM

## 2023-07-18 DIAGNOSIS — I10 Essential (primary) hypertension: Secondary | ICD-10-CM | POA: Diagnosis not present

## 2023-07-18 DIAGNOSIS — E1129 Type 2 diabetes mellitus with other diabetic kidney complication: Secondary | ICD-10-CM

## 2023-07-18 DIAGNOSIS — E785 Hyperlipidemia, unspecified: Secondary | ICD-10-CM

## 2023-07-18 DIAGNOSIS — M810 Age-related osteoporosis without current pathological fracture: Secondary | ICD-10-CM

## 2023-07-18 DIAGNOSIS — I779 Disorder of arteries and arterioles, unspecified: Secondary | ICD-10-CM

## 2023-07-18 DIAGNOSIS — E559 Vitamin D deficiency, unspecified: Secondary | ICD-10-CM

## 2023-07-18 LAB — HM DIABETES FOOT EXAM

## 2023-07-18 MED ORDER — SOTALOL HCL 80 MG PO TABS: 80.0000 mg | ORAL_TABLET | Freq: Every day | ORAL | 0 refills | Status: DC

## 2023-07-18 NOTE — Progress Notes (Addendum)
 Subjective:    Patient ID: Tanya Aguirre, female    DOB: 10-21-1940, 83 y.o.   MRN: [id number]  Patient here for  Chief Complaint  Patient presents with   Medical Management of Chronic Issues    HPI Here to follow up regarding hypercholesterolemia, diabetes, afib (on sotalol  and cardizem ) and hypertension. CVA in 2021. Has been followed by Dr Erica. New pt to me. Regarding her diabetes, currently on toujeo . Last A1c 10.7 (05/2023). Off metformin due to her kidney issues. Also concern with low sugars. Given kidney issues and previous problem and concern (and want to avoid) low sugars, hold on starting sulfonylureas. Discussed treatment options, including GLP 1 agonist and SGLT2 inhibitors. Discussed low carb diet and exercise. Reviewed her meter averages - 7 day average 165, 14 day average 189 and 30 day average 211. No chest pain.  Breathing stable. No abdominal pain or bowel change.    Past Medical History:  Diagnosis Date   A-fib (HCC)    Allergy    Arthritis    Asthma    Diabetes mellitus without complication (HCC)    Hyperlipidemia    Hypertension    Stroke [facility])    Past Surgical History:  Procedure Laterality Date   BREAST LUMPECTOMY Right    Family History  Problem Relation Age of Onset   Stroke Mother    Hypertension Mother    Diabetes Mother    Heart attack Father    Heart attack Sister    Kidney disease Sister    Hypertension Sister    Diabetes Brother    Hypertension Brother    Diabetes Mellitus II Brother    Stroke Sister    Hypertension Sister    Diabetes Sister    Hypertension Brother    Diabetes Brother    Hypertension Sister    Diabetes Sister    Arthritis Sister    Social History   Socioeconomic History   Marital status: Married    Spouse name: Not on file   Number of children: Not on file   Years of education: Not on file   Highest education level: Not on file  Occupational History   Not on file  Tobacco Use   Smoking status: Never    Smokeless tobacco: Never  Substance and Sexual Activity   Alcohol use: Never   Drug use: Never   Sexual activity: Not on file  Other Topics Concern   Not on file  Social History Narrative   Not on file   Social Drivers of Health   Financial Resource Strain: Not on file  Food Insecurity: Not on file  Transportation Needs: Not on file  Physical Activity: Not on file  Stress: Not on file  Social Connections: Not on file     Review of Systems  Constitutional:  Negative for appetite change and unexpected weight change.  HENT:  Negative for congestion and sinus pressure.   Respiratory:  Negative for cough and chest tightness.        Breathing stable.   Cardiovascular:  Negative for chest pain and palpitations.  Gastrointestinal:  Negative for abdominal pain, diarrhea, nausea and vomiting.  Genitourinary:  Negative for difficulty urinating and dysuria.  Musculoskeletal:  Negative for joint swelling and myalgias.  Skin:  Negative for color change and rash.  Neurological:  Negative for dizziness, light-headedness and headaches.  Psychiatric/Behavioral:  Negative for agitation and dysphoric mood.        Objective:     BP 126/72  Pulse 90   Temp 98 F (36.7 C)   Resp 16   Ht 5' 5 (1.651 m)   Wt 250 lb 3.2 oz (113.5 kg)   SpO2 98%   BMI 41.64 kg/m  Wt Readings from Last 3 Encounters:  07/18/23 250 lb 3.2 oz (113.5 kg)  05/17/23 246 lb (111.6 kg)  12/03/19 235 lb 11.2 oz (106.9 kg)    Physical Exam Vitals reviewed.  Constitutional:      General: She is not in acute distress.    Appearance: Normal appearance.  HENT:     Head: Normocephalic and atraumatic.     Right Ear: External ear normal.     Left Ear: External ear normal.     Mouth/Throat:     Pharynx: No oropharyngeal exudate or posterior oropharyngeal erythema.  Eyes:     General: No scleral icterus.       Right eye: No discharge.        Left eye: No discharge.     Conjunctiva/sclera: Conjunctivae  normal.  Neck:     Thyroid : No thyromegaly.  Cardiovascular:     Rate and Rhythm: Normal rate and regular rhythm.  Pulmonary:     Effort: No respiratory distress.     Breath sounds: Normal breath sounds. No wheezing.  Abdominal:     General: Bowel sounds are normal.     Palpations: Abdomen is soft.     Tenderness: There is no abdominal tenderness.  Musculoskeletal:        General: No swelling or tenderness.     Cervical back: Neck supple. No tenderness.  Lymphadenopathy:     Cervical: No cervical adenopathy.  Skin:    Findings: No erythema or rash.  Neurological:     Mental Status: She is alert.  Psychiatric:        Mood and Affect: Mood normal.        Behavior: Behavior normal.      Outpatient Encounter Medications as of 07/18/2023  Medication Sig   ACCU-CHEK AVIVA PLUS test strip 1 each 2 (two) times daily.   Accu-Chek FastClix Lancets MISC Apply 1 each topically 2 (two) times daily.   apixaban  (ELIQUIS ) 5 MG TABS tablet Take 1 tablet (5 mg total) by mouth 2 (two) times daily.   diltiazem  (CARDIZEM ) 120 MG tablet Take 120 mg by mouth at bedtime.   DROPLET PEN NEEDLES 31G X 5 MM MISC    furosemide  (LASIX ) 40 MG tablet Take 1 tablet (40 mg total) by mouth daily.   potassium chloride  SA (KLOR-CON ) 20 MEQ tablet Take 20 mEq by mouth daily.   simvastatin  (ZOCOR ) 20 MG tablet Take 20 mg by mouth at bedtime.   sotalol  (BETAPACE ) 80 MG tablet Take 1 tablet (80 mg total) by mouth at bedtime.   TOUJEO  SOLOSTAR 300 UNIT/ML Solostar Pen Inject 20 Units into the skin daily.   Vitamin D , Ergocalciferol , (DRISDOL) 1.25 MG (50000 UNIT) CAPS capsule Take 50,000 Units by mouth once a week.   [DISCONTINUED] sotalol  (BETAPACE ) 80 MG tablet Take 80 mg by mouth at bedtime.   No facility-administered encounter medications on file as of 07/18/2023.     Lab Results  Component Value Date   WBC 6.0 05/31/2023   HGB 13.1 05/31/2023   HCT 40.5 05/31/2023   PLT 209.0 05/31/2023   GLUCOSE 213 (H)  05/31/2023   CHOL 174 05/31/2023   TRIG 120.0 05/31/2023   HDL 48.50 05/31/2023   LDLCALC 101 (H) 05/31/2023   ALT  7 05/31/2023   AST 11 05/31/2023   NA 138 05/31/2023   K 4.4 05/31/2023   CL 101 05/31/2023   CREATININE 1.27 (H) 05/31/2023   BUN 24 (H) 05/31/2023   CO2 29 05/31/2023   TSH 2.29 05/31/2023   HGBA1C 10.7 (H) 05/31/2023    CT ANGIO HEAD W OR WO CONTRAST Result Date: 11/30/2019 CLINICAL DATA:  Stroke on MRI, multiple lung nodules EXAM: CT ANGIOGRAPHY HEAD TECHNIQUE: Multidetector CT imaging of the head was performed using the standard protocol during bolus administration of intravenous contrast. Multiplanar CT image reconstructions and MIPs were obtained to evaluate the vascular anatomy. CONTRAST:  OMNIPAQUE  IOHEXOL  350 MG/ML SOLN COMPARISON:  None. FINDINGS: CT HEAD Brain: There is no acute intracranial hemorrhage. More well-defined hypoattenuation is present in the right cerebellar hemisphere consistent with evolving acute infarction. The small left cerebellar infarct is better seen on MRI. There remains no significant mass effect in the posterior fossa or hydrocephalus. Chronic inferior right frontal infarct. No new loss of gray-white differentiation. Vascular: There is intracranial atherosclerotic calcification at the skull base. Skull: Unremarkable. Sinuses: Visualized portions are aerated. Orbits: Visualized portions are unremarkable. CTA HEAD Anterior circulation: Intracranial internal carotid arteries patent with calcified plaque causing mild stenosis. Anterior and middle cerebral arteries are patent. Posterior circulation: Intracranial vertebral arteries, basilar artery, and posterior cerebral arteries are patent. Right PICA origin is not well seen. AICA origins are patent. Right superior cerebellar artery origin not well seen and there appears to be diminished flow within this vessel. There are bilateral posterior communicating arteries. There is a 2 mm inferiorly  directed outpouching at the right PCOM origin. The vessel may rise eccentrically rather than at the apex of this outpouching and aneurysm cannot be excluded. Venous sinuses: Not well evaluated on this study. IMPRESSION: Evolving acute right cerebellar infarct. Small left cerebellar infarct better seen on MRI. There remains no significant mass effect or hydrocephalus. No acute intracranial hemorrhage. Right PICA and SCA origins are not well seen. 2 mm infundibulum or aneurysm at the right posterior communicating artery origin. Electronically Signed   By: Santina Blanch M.D.   On: 11/30/2019 15:11   CT CHEST W CONTRAST Result Date: 11/30/2019 CLINICAL DATA:  Nodular densities on recent CT abdomen examination EXAM: CT CHEST WITH CONTRAST TECHNIQUE: Multidetector CT imaging of the chest was performed during intravenous contrast administration. CONTRAST:  OMNIPAQUE  IOHEXOL  350 MG/ML SOLN COMPARISON:  100 mL Omnipaque  350. FINDINGS: Cardiovascular: Atherosclerotic calcifications of the thoracic aorta are noted. No aneurysmal dilatation or dissection is seen. Truncus anomaly is noted. Pulmonary artery as visualized is within normal limits. No significant coronary calcifications are noted. Heart is at the upper limits of normal in size. Mediastinum/Nodes: Thoracic inlet is within normal limits. No hilar or mediastinal adenopathy is noted. The esophagus is within normal limits. Lungs/Pleura: Lungs are well aerated bilaterally. Multiple bilateral subcentimeter nodules are noted throughout both lungs again suspicious for metastatic disease. No dominant nodule is seen. No sizable effusion is noted. Upper Abdomen: Upper abdomen demonstrates cholelithiasis without complicating factors. Normal excretion of contrast is noted from the kidneys. No other focal abnormality is seen. Musculoskeletal: Degenerative changes of the thoracic spine are noted. No lytic or sclerotic bony lesion is seen. IMPRESSION: Multiple  subcentimeter sub solid nodules are seen highly suspicious for metastatic disease. Tissue sampling may be helpful. Cholelithiasis without complicating factors. Aortic Atherosclerosis (ICD10-I70.0). Electronically Signed   By: Oneil Devonshire M.D.   On: 11/30/2019 15:09   ECHOCARDIOGRAM  COMPLETE Result Date: 11/30/2019    ECHOCARDIOGRAM REPORT   Patient Name:   [facility] Date of Exam: 11/30/2019 Medical Rec #:  [id number]         Height:       65.0 in Accession #:    [id number]        Weight:       238.4 lb Date of Birth:  04-12-1941          BSA:          2.131 m Patient Age:    79 years          BP:           139/53 mmHg Patient Gender: F                 HR:           68 bpm. Exam Location:  [facility] Procedure: 2D Echo, Color Doppler and Cardiac Doppler Indications:     I163.9 Stroke  History:         Patient has no prior history of Echocardiogram examinations.                  Arrythmias:Atrial Fibrillation; Risk Factors:Hypertension and                  Diabetes.  Sonographer:     Candis Led RDCS (AE) Referring Phys:  [id number] CALEB NIU Diagnosing Phys: Evalene Lunger MD  Sonographer Comments: Suboptimal subcostal window. IMPRESSIONS  1. Left ventricular ejection fraction, by estimation, is 60 to 65%. The left ventricle has normal function. The left ventricle has no regional wall motion abnormalities. Left ventricular diastolic parameters are indeterminate.  2. Right ventricular systolic function is normal. The right ventricular size is normal.  3. Left atrial size was mildly dilated.  4. The mitral valve is normal in structure. Mild mitral valve regurgitation.  5. Rhythm is atrial fibrillation FINDINGS  Left Ventricle: Left ventricular ejection fraction, by estimation, is 60 to 65%. The left ventricle has normal function. The left ventricle has no regional wall motion abnormalities. The left ventricular internal cavity size was normal in size. There is  no left ventricular hypertrophy. Left ventricular diastolic  parameters are indeterminate. Right Ventricle: The right ventricular size is normal. No increase in right ventricular wall thickness. Right ventricular systolic function is normal. Left Atrium: Left atrial size was mildly dilated. Right Atrium: Right atrial size was normal in size. Pericardium: There is no evidence of pericardial effusion. Mitral Valve: The mitral valve is normal in structure. Normal mobility of the mitral valve leaflets. Mild mitral valve regurgitation. No evidence of mitral valve stenosis. MV peak gradient, 5.3 mmHg. The mean mitral valve gradient is 1.0 mmHg. Tricuspid Valve: The tricuspid valve is normal in structure. Tricuspid valve regurgitation is not demonstrated. No evidence of tricuspid stenosis. Aortic Valve: The aortic valve is normal in structure. Aortic valve regurgitation is not visualized. No aortic stenosis is present. Aortic valve mean gradient measures 2.0 mmHg. Aortic valve peak gradient measures 4.7 mmHg. Aortic valve area, by VTI measures 3.13 cm. Pulmonic Valve: The pulmonic valve was normal in structure. Pulmonic valve regurgitation is not visualized. No evidence of pulmonic stenosis. Aorta: The aortic root is normal in size and structure. Venous: The inferior vena cava is normal in size with greater than 50% respiratory variability, suggesting right atrial pressure of 3 mmHg. IAS/Shunts: No atrial level shunt detected by color flow Doppler.  LEFT VENTRICLE PLAX 2D LVIDd:  4.35 cm  Diastology LVIDs:         2.39 cm  LV e' lateral:   11.50 cm/s LV PW:         0.92 cm  LV E/e' lateral: 9.6 LV IVS:        0.78 cm  LV e' medial:    8.05 cm/s LVOT diam:     2.20 cm  LV E/e' medial:  13.7 LV SV:         65 LV SV Index:   30 LVOT Area:     3.80 cm  LEFT ATRIUM             Index LA diam:        3.80 cm 1.78 cm/m LA Vol (A2C):   51.0 ml 23.93 ml/m LA Vol (A4C):   70.6 ml 33.12 ml/m LA Biplane Vol: 62.8 ml 29.46 ml/m  AORTIC VALVE                   PULMONIC VALVE AV Area  (Vmax):    2.91 cm    PV Vmax:       0.73 m/s AV Area (Vmean):   3.12 cm    PV Vmean:      50.500 cm/s AV Area (VTI):     3.13 cm    PV VTI:        0.122 m AV Vmax:           108.00 cm/s PV Peak grad:  2.1 mmHg AV Vmean:          69.100 cm/s PV Mean grad:  1.0 mmHg AV VTI:            0.208 m AV Peak Grad:      4.7 mmHg AV Mean Grad:      2.0 mmHg LVOT Vmax:         82.80 cm/s LVOT Vmean:        56.700 cm/s LVOT VTI:          0.171 m LVOT/AV VTI ratio: 0.82  AORTA Ao Root diam: 2.70 cm MITRAL VALVE MV Area (PHT): 4.59 cm     SHUNTS MV Peak grad:  5.3 mmHg     Systemic VTI:  0.17 m MV Mean grad:  1.0 mmHg     Systemic Diam: 2.20 cm MV Vmax:       1.15 m/s MV Vmean:      51.2 cm/s MV Decel Time: 165 msec MV E velocity: 110.33 cm/s MV A velocity: 49.50 cm/s MV E/A ratio:  2.23 Evalene Lunger MD Electronically signed by Evalene Lunger MD Signature Date/Time: 11/30/2019/1:28:47 PM    Final        Assessment & Plan:  Type 2 diabetes mellitus with hyperglycemia, unspecified whether long term insulin  use (HCC) -     Microalbumin / creatinine urine ratio; Future  Atrial fibrillation, unspecified type [facility]) Assessment & Plan: Has been followed by Dr Erica. Has been on eliquis , betapace  and diltiazem . No increased heart rate or palpitations reported. Breathing stable. No chest pain. Discussed further evaluation given persistent afib. Would like to get her established with cardiology to discuss other treatment options, including cardioversion, medication adjustment, etc.  Agreeable. Request referral to Dr Florencio.   Orders: -     AMB Referral VBCI Care Management -     Ambulatory referral to Cardiology  Aortic atherosclerosis [facility]) Assessment & Plan: Continue simvastatin .    B12 deficiency Assessment & Plan: Check B12 level  with next labs.    Bilateral carotid artery disease, unspecified type (HCC) Assessment & Plan: Mild stenosis of the right proximal internal carotid artery (<50% stenosis in  the range of 16-49%). Mild stenosis of the left ICA (<50% in the range of 16-49%) - 07/22/22.  Continues on simvastatin .    History of CVA (cerebrovascular accident) Assessment & Plan: History of cerebral infarction posterior circulation brain stem. On eliquis .  Continue risk factor modification, including statin.    Hyperlipidemia, unspecified hyperlipidemia type Assessment & Plan: On simvastatin .  Low cholesterol diet and exercise.  Follow lipid panel and liver function tests.    Primary hypertension Assessment & Plan: Currently on diltiazem  and betapace . Follow pressures.  Follow metabolic panel. Will continue these medications for now.  Have cardiology evaluate - question of need for medication adjustment.    Multinodular goiter Assessment & Plan: S/p FNA - negative for malignancy.  Follow tsh.    Osteoporosis without current pathological fracture, unspecified osteoporosis type Assessment & Plan: Documented history of osteoporosis. In review, previously on fosamax. Not taking now.  Calcium  and vitamin D .    Vitamin D  deficiency Assessment & Plan: Documented history of vitamin D  def.  Follow vitamin D  level. Check with next labs.    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  Off metformin.  Discussed other treatment options. Discussed GLP 1 agonist, SGLT 2 inhibitors. Would like to avoid sulfonylureas. Discussed low carb diet and exercise. Given A1c 10.7, hold on starting SGLT 2. Discussed adjusting insulin  to see if can get better control of sugars prior to starting SGLT 2 inhibitor. She has an appt with endocrinology 07/27/23. Hold on adding medication, since evaluation next week.  Will get their input regarding above treatment. Given CKD and diabetes, etc, discussed with her that I would like to get her on SGLT 2 inhibitor.  Follow met b and A1c. Keep appt with endocrinology.    Other orders -     Sotalol  HCl;  Take 1 tablet (80 mg total) by mouth at bedtime.  Dispense: 30 tablet; Refill: 0     Allena Hamilton, MD

## 2023-07-21 ENCOUNTER — Telehealth: Payer: Self-pay | Admitting: Internal Medicine

## 2023-07-21 ENCOUNTER — Telehealth: Payer: Self-pay

## 2023-07-21 NOTE — Telephone Encounter (Signed)
 Copied from CRM [id number]. Topic: Medicare AWV >> Jul 21, 2023 10:36 AM Nathanel DEL wrote: Reason for CRM: Called LVM 07/21/2023 to schedule AWV. Please schedule office or virtual visits.  Nathanel Paschal; Care Guide Ambulatory Clinical Support [place] [facility] Direct Dial: [phone]

## 2023-07-21 NOTE — Progress Notes (Signed)
 Care Guide Pharmacy Note  07/21/2023 Name: Tanya Aguirre MRN: [id number] DOB: October 23, 1940  Referred By: Glendia Shad, MD Reason for referral: Care Coordination (Outreach to schedule with Pharmd)   Tanya Aguirre is a 83 y.o. year old female who is a primary care patient of Glendia Shad, MD.  Tanya Aguirre was referred to the pharmacist for assistance related to: Atrial Fibrillation  Successful contact was made with the patient to discuss pharmacy services including being ready for the pharmacist to call at least 5 minutes before the scheduled appointment time and to have medication bottles and any blood pressure readings ready for review. The patient agreed to meet with the pharmacist via telephone visit on (date/time).07/25/2023  Jeoffrey Buffalo , RMA     [place]  [facility], [facility] Guide  Direct Dial: [phone]  Website: [email or web address]

## 2023-07-23 ENCOUNTER — Encounter: Payer: Self-pay | Admitting: Internal Medicine

## 2023-07-23 NOTE — Assessment & Plan Note (Signed)
 History of cerebral infarction posterior circulation brain stem. On eliquis.  Continue risk factor modification, including statin.

## 2023-07-23 NOTE — Assessment & Plan Note (Signed)
 Documented history of osteoporosis. In review, previously on fosamax. Not taking now.  Calcium and vitamin D.

## 2023-07-23 NOTE — Assessment & Plan Note (Signed)
 Mild stenosis of the right proximal internal carotid artery (<50% stenosis in the range of 16-49%). Mild stenosis of the left ICA (<50% in the range of 16-49%) - 07/22/22.  Continues on simvastatin.

## 2023-07-23 NOTE — Assessment & Plan Note (Signed)
 Currently on diltiazem and betapace. Follow pressures.  Follow metabolic panel. Will continue these medications for now.  Have cardiology evaluate - question of need for medication adjustment.

## 2023-07-23 NOTE — Assessment & Plan Note (Signed)
 Check B12 level with next labs.

## 2023-07-23 NOTE — Assessment & Plan Note (Signed)
 Has been followed by Dr Erica. Has been on eliquis , betapace  and diltiazem . No increased heart rate or palpitations reported. Breathing stable. No chest pain. Discussed further evaluation given persistent afib. Would like to get her established with cardiology to discuss other treatment options, including cardioversion, medication adjustment, etc.  Agreeable. Request referral to Dr Florencio.

## 2023-07-23 NOTE — Assessment & Plan Note (Signed)
 Continue simvastatin.

## 2023-07-23 NOTE — Assessment & Plan Note (Signed)
On simvastatin.  Low cholesterol diet and exercise.  Follow lipid panel and liver function tests.   

## 2023-07-23 NOTE — Assessment & Plan Note (Signed)
 S/p FNA - negative for malignancy.  Follow tsh.

## 2023-07-23 NOTE — Assessment & Plan Note (Signed)
 On toujeo .  Off metformin.  Discussed other treatment options. Discussed GLP 1 agonist, SGLT 2 inhibitors. Would like to avoid sulfonylureas. Discussed low carb diet and exercise. Given A1c 10.7, hold on starting SGLT 2. Discussed adjusting insulin  to see if can get better control of sugars prior to starting SGLT 2 inhibitor. She has an appt with endocrinology 07/27/23. Hold on adding medication, since evaluation next week.  Will get their input regarding above treatment. Given CKD and diabetes, etc, discussed with her that I would like to get her on SGLT 2 inhibitor.  Follow met b and A1c. Keep appt with endocrinology.

## 2023-07-23 NOTE — Assessment & Plan Note (Signed)
 Documented history of vitamin D def.  Follow vitamin D level. Check with next labs.

## 2023-07-25 ENCOUNTER — Other Ambulatory Visit (INDEPENDENT_AMBULATORY_CARE_PROVIDER_SITE_OTHER): Payer: Medicare HMO

## 2023-07-25 DIAGNOSIS — E1165 Type 2 diabetes mellitus with hyperglycemia: Secondary | ICD-10-CM

## 2023-07-25 DIAGNOSIS — E1129 Type 2 diabetes mellitus with other diabetic kidney complication: Secondary | ICD-10-CM

## 2023-07-25 NOTE — Progress Notes (Signed)
   07/25/2023 Name: Tanya Aguirre MRN: [id number] DOB: [birth year]  Subjective  Chief Complaint  Patient presents with   Medication Access   Diabetes    Care Team: Primary Care Provider: Glendia Shad, MD  Reason for visit: ?  Tanya Aguirre is a 83 y.o. female who presents today for a telephone visit with the pharmacist due to medication access concerns regarding their Eliquis . ?   Medication Access: ?  Prescription drug coverage: Payor: HUMANA MEDICARE / Plan: HUMANA MEDICARE HMO / Product Type: *No Product type* / .   Reports that her Eliquis  was most recently $297 at the start of the year (which she did pay). She was told that her deductible this year was $250 which the pharmacy confirmed upon her first fill. She notes that her usual copay outside of the coverage gap/deductible phases is affordable. Her next fill should be back to usual cost.   Other brand name medication includes: Insulin  (Toujeo  U300) ~$125 / 90ds. Previously on Jardiance which she reported she liked very much though cost was too high. Reports she is scheduled with Endocrinologist on Wednesday for initial visit.   Current Patient Assistance:  None Patient lives in a household of 2 with her spouse with an estimated combined monthly income of just under $3000 .  Medicare LIS Eligible: No  Couples Income Limit $2485/month - Exceeds  Assessment and Plan:   1. Medication Access Eliquis  We discussed the limitations of Eliquis  patient assistance program (eg 3% salary OOP spend requirement is roughly $1000). Discussed elimination of donut hole. Cost should be back to usual on next fill given she has seemingly met her initial deductible.  Diabetes Medication Discussed that she is likely eligible for Medication Assistance for her insulin  though we agreed to defer starting any application until after Wednesday (reports Endo initial visit). She also reports interest in resuming Jardiance or SGLT2i given tolerated  well in the past with cost being the only barrier. SGLT2i ideal in the setting of diabetic kidney disease upon evidence of slightly better glycemic control per possible risk volume depletion/dehydration in the setting of hyperglycemia.  Future Consideration: Toujeo  U300: [employer]ofi (FPL 400%) [place] U200: NovoNordisk (400%); GLP1 BI Cares: Jardiance (250% FPL)  ?  Farxiga through [employer] (300% FPL)  2. Diabetes; Uncontrolled per A1c 10.7% (05/31/23). Establishing care with Endo scheduled Wednesday per patient.  Future considerations: Insulin (s) ? PAP (likely eligible per estimated income) MTF: Not appropriate given current renal function SU: Not ideal given current insulin  use and renal impairment.  SLGT2i: Ideal agent in the setting of DKD. Defer to slight A1c-improvement ~9% or less or as instructed by endo.  GLP1RA: Ideal agent in the setting of A1c >10% and concurrent CVD and renal disease. Ozempic via NovoNordisk PAP.  Ideally, patient would benefit from both GLP1 and SGLT2i as amenable/as tolerated. Likely eligible for both through PAP programs though can take several weeks for approval/shipment.   Plan to reach out to patient at end of week to review endo's recommendations and start patient assistance as needed for her insulin .   Patient given pharmacist's direct line for any questions/concerns with medications or cost.   Future Appointments  Date Time Provider Department Center  07/25/2023  1:00 PM [facility] PHARMACIST [facility]  09/12/2023  8:45 AM [facility]  09/15/2023 10:00 AM Glendia Shad, MD [facility]    [name], PharmD Clinical Pharmacist [facility] [phone]

## 2023-07-25 NOTE — Patient Instructions (Addendum)
 Ms. Tanya Aguirre,   It was a pleasure to speak with you today! As we discussed:?   Continue all medications as you have been taking them.  We will touch base after you visit with Endocrinology to see what they recommend.  You are likely eligible for one or more programs that would help cover the cost of your diabetes medications.   Programs are available for most insulins as well as Jardiance and medications such as Ozempic.  I would assist you with your applications to make the process easier (or your Endocrinology office may offer to do this, either is fine)   Please reach out prior to your next scheduled appointment should you have any questions or concerns or if you do not hear from me after your Endocrinology visit. My direct desk line is: [phone] (please leave voicemail if you do not get an answer).  Thank you!   Future Appointments  Date Time Provider Department Center  07/25/2023  1:00 PM [facility] PHARMACIST [facility]  09/12/2023  8:45 AM [facility]  09/15/2023 10:00 AM Glendia Shad, MD [facility]    [name], PharmD Clinical Pharmacist [facility] [phone]

## 2023-07-27 DIAGNOSIS — E1159 Type 2 diabetes mellitus with other circulatory complications: Secondary | ICD-10-CM | POA: Diagnosis not present

## 2023-07-27 DIAGNOSIS — E1122 Type 2 diabetes mellitus with diabetic chronic kidney disease: Secondary | ICD-10-CM | POA: Diagnosis not present

## 2023-07-27 DIAGNOSIS — Z794 Long term (current) use of insulin: Secondary | ICD-10-CM | POA: Diagnosis not present

## 2023-07-27 DIAGNOSIS — I152 Hypertension secondary to endocrine disorders: Secondary | ICD-10-CM | POA: Diagnosis not present

## 2023-07-27 DIAGNOSIS — N1832 Chronic kidney disease, stage 3b: Secondary | ICD-10-CM | POA: Diagnosis not present

## 2023-07-27 DIAGNOSIS — E1169 Type 2 diabetes mellitus with other specified complication: Secondary | ICD-10-CM | POA: Diagnosis not present

## 2023-07-27 DIAGNOSIS — E785 Hyperlipidemia, unspecified: Secondary | ICD-10-CM | POA: Diagnosis not present

## 2023-07-27 DIAGNOSIS — E1165 Type 2 diabetes mellitus with hyperglycemia: Secondary | ICD-10-CM | POA: Diagnosis not present

## 2023-08-01 ENCOUNTER — Encounter: Payer: Self-pay | Admitting: Pharmacist

## 2023-08-01 NOTE — Progress Notes (Signed)
Patient seen last week by PharmD to discuss medication cost. PAP applications deferred given Endo initial visit scheduled 07/27/23.   Per Endocrinology 07/27/23: Tanya Aguirre to QHS dosing to better control her blood sugars Toujeo 20 units daily, instructed how to titrate dose to achieve target fasting sugars below 150 mg/dl Discussed she would benefit from renal protection and Hb A1c lowering effect of SGLT2-i. I advised she speak with the CCM team and see if they are also able to get Jardiance 25 mg daily tablets through patient assistance.    Called patient to discuss. She reports that she may have estimated her income too low last week. Today reports it is closer to $50000 last year as her husband worked part time.  Patient will call back once she has copy of SSI forms/W2. Given PharmD direct line.  In the meantime, applications initiated for PAP for Jardiance/Farxiga and Toujeo U200 (see Medication Assistance Documentation Encounter for application details).  Loree Fee, PharmD Clinical Pharmacist [facility] [phone]

## 2023-08-01 NOTE — Progress Notes (Addendum)
 [employer] Manufacturer Assistance Program (MAP) Application   Manufacturer: [employer]ofi    (New enrollment) Medication(s): Toujeo U300 Solostar  Patient Portion of Application:  08/01/23:  Filled out by Pharmacist. Awaiting patient call back w income documentation.  Patient agreeable to signing in clinic or via mail.  08/09/23: Called patient to f/u -- Left voicemail with direct cb# [phone] 1/28: Patient reports she is waiting on pension statement which should come at end of month. She thinks she may be over income limit (has calculated >$50k so far) Income Documentation: Patient agreeable to bringing to clinic or via mail (will discuss on f/u phone call).   Provider Portion of Application:  08/01/23: Provider portion completed by PharmD and uploaded PCP eFax folder for signature.  Prescription(s): Included in MAP application. Instructions per Endo Visit 07/27/23: take Toujeo at bedtime between 8-10 pm is fine. Start with 20 units, then check your blood sugar the next morning on an empty stomach. If it is over 150, then increase your dose of Toujeo by 1 extra unit.   Keep increasing the dose by 1 unit every night until you wake up with a morning fasting sugar between 80 - 150.    Application Status: Not submitted (pending signatures)  Next Steps: []    Patient signature []    PCP signature []    Upon signature(s) Application to be faxed to [employer]: [phone] with copy of patient's insurance card AND scanned into patient chart  ___________________________________________________________________________ [facility] CARES Manufacturer Assistance Program (MAP) Application   Manufacturer: [employer] (BI Cares)    (New enrollment) Medication(s): Jardiance 25 mg tablet  Patient Portion of Application:  08/01/23:  Filled out by PharmD. Patient okay signing in clinic or by mail (to discuss when pt calls back) Income Documentation:  Patient to drop off in clinic/mail to clinic  Provider  Portion of Application:  08/01/23:  Once patient signature is received, will either give her application to give to Endo, or we can Fax to Endo office (or both) Prescription(s): Included in MAP application. Jardiance 25 mg tablety once daily (Prescribed/managed by Endo)   Application Status: Not submitted (pending signatures) 3/19: Patient called reporting she was notified that HCP portion has not been received. Advised her to contact endo to ensure they complete/send this to [facility]. Patient to call me back if further questions arise.   Next Steps: [x]    Patient signature []    PCP signature > To bring to Endo  Forwarded to [facility] CPhT Patient Advocate Team for future correspondences/re-enrollment (Sanofi).   *[facility] clinic team - Please Addend/update this note as the "Next Steps" are completed in office*

## 2023-08-09 DIAGNOSIS — E1165 Type 2 diabetes mellitus with hyperglycemia: Secondary | ICD-10-CM | POA: Diagnosis not present

## 2023-08-09 DIAGNOSIS — E1129 Type 2 diabetes mellitus with other diabetic kidney complication: Secondary | ICD-10-CM | POA: Diagnosis not present

## 2023-08-09 DIAGNOSIS — I480 Paroxysmal atrial fibrillation: Secondary | ICD-10-CM | POA: Diagnosis not present

## 2023-08-09 DIAGNOSIS — E66813 Obesity, class 3: Secondary | ICD-10-CM | POA: Diagnosis not present

## 2023-08-09 DIAGNOSIS — I639 Cerebral infarction, unspecified: Secondary | ICD-10-CM | POA: Diagnosis not present

## 2023-08-09 DIAGNOSIS — I10 Essential (primary) hypertension: Secondary | ICD-10-CM | POA: Diagnosis not present

## 2023-08-09 DIAGNOSIS — E119 Type 2 diabetes mellitus without complications: Secondary | ICD-10-CM | POA: Diagnosis not present

## 2023-08-09 DIAGNOSIS — E782 Mixed hyperlipidemia: Secondary | ICD-10-CM | POA: Diagnosis not present

## 2023-08-09 DIAGNOSIS — Z8673 Personal history of transient ischemic attack (TIA), and cerebral infarction without residual deficits: Secondary | ICD-10-CM | POA: Diagnosis not present

## 2023-08-10 ENCOUNTER — Other Ambulatory Visit: Payer: Self-pay | Admitting: Internal Medicine

## 2023-08-25 DIAGNOSIS — I480 Paroxysmal atrial fibrillation: Secondary | ICD-10-CM | POA: Diagnosis not present

## 2023-09-06 ENCOUNTER — Other Ambulatory Visit: Payer: Self-pay | Admitting: Internal Medicine

## 2023-09-06 DIAGNOSIS — N1832 Chronic kidney disease, stage 3b: Secondary | ICD-10-CM | POA: Diagnosis not present

## 2023-09-06 DIAGNOSIS — Z794 Long term (current) use of insulin: Secondary | ICD-10-CM | POA: Diagnosis not present

## 2023-09-06 DIAGNOSIS — E1165 Type 2 diabetes mellitus with hyperglycemia: Secondary | ICD-10-CM | POA: Diagnosis not present

## 2023-09-06 DIAGNOSIS — E1159 Type 2 diabetes mellitus with other circulatory complications: Secondary | ICD-10-CM | POA: Diagnosis not present

## 2023-09-06 DIAGNOSIS — E1169 Type 2 diabetes mellitus with other specified complication: Secondary | ICD-10-CM | POA: Diagnosis not present

## 2023-09-06 DIAGNOSIS — I10 Essential (primary) hypertension: Secondary | ICD-10-CM | POA: Diagnosis not present

## 2023-09-06 DIAGNOSIS — E785 Hyperlipidemia, unspecified: Secondary | ICD-10-CM | POA: Diagnosis not present

## 2023-09-06 DIAGNOSIS — Z8673 Personal history of transient ischemic attack (TIA), and cerebral infarction without residual deficits: Secondary | ICD-10-CM | POA: Diagnosis not present

## 2023-09-06 DIAGNOSIS — I2510 Atherosclerotic heart disease of native coronary artery without angina pectoris: Secondary | ICD-10-CM | POA: Diagnosis not present

## 2023-09-06 DIAGNOSIS — E119 Type 2 diabetes mellitus without complications: Secondary | ICD-10-CM | POA: Diagnosis not present

## 2023-09-06 DIAGNOSIS — E66813 Obesity, class 3: Secondary | ICD-10-CM | POA: Diagnosis not present

## 2023-09-06 DIAGNOSIS — I152 Hypertension secondary to endocrine disorders: Secondary | ICD-10-CM | POA: Diagnosis not present

## 2023-09-06 DIAGNOSIS — I639 Cerebral infarction, unspecified: Secondary | ICD-10-CM | POA: Diagnosis not present

## 2023-09-06 DIAGNOSIS — I480 Paroxysmal atrial fibrillation: Secondary | ICD-10-CM | POA: Diagnosis not present

## 2023-09-06 DIAGNOSIS — E1122 Type 2 diabetes mellitus with diabetic chronic kidney disease: Secondary | ICD-10-CM | POA: Diagnosis not present

## 2023-09-06 DIAGNOSIS — I4891 Unspecified atrial fibrillation: Secondary | ICD-10-CM | POA: Diagnosis not present

## 2023-09-06 DIAGNOSIS — E782 Mixed hyperlipidemia: Secondary | ICD-10-CM | POA: Diagnosis not present

## 2023-09-09 ENCOUNTER — Other Ambulatory Visit: Payer: Self-pay

## 2023-09-09 DIAGNOSIS — I10 Essential (primary) hypertension: Secondary | ICD-10-CM

## 2023-09-09 DIAGNOSIS — E119 Type 2 diabetes mellitus without complications: Secondary | ICD-10-CM

## 2023-09-09 DIAGNOSIS — E785 Hyperlipidemia, unspecified: Secondary | ICD-10-CM

## 2023-09-12 ENCOUNTER — Other Ambulatory Visit: Payer: Self-pay | Admitting: Internal Medicine

## 2023-09-12 ENCOUNTER — Other Ambulatory Visit (INDEPENDENT_AMBULATORY_CARE_PROVIDER_SITE_OTHER): Payer: Medicare HMO

## 2023-09-12 DIAGNOSIS — E785 Hyperlipidemia, unspecified: Secondary | ICD-10-CM | POA: Diagnosis not present

## 2023-09-12 DIAGNOSIS — E1165 Type 2 diabetes mellitus with hyperglycemia: Secondary | ICD-10-CM | POA: Diagnosis not present

## 2023-09-12 DIAGNOSIS — I10 Essential (primary) hypertension: Secondary | ICD-10-CM

## 2023-09-12 DIAGNOSIS — E119 Type 2 diabetes mellitus without complications: Secondary | ICD-10-CM | POA: Diagnosis not present

## 2023-09-12 LAB — HEPATIC FUNCTION PANEL
ALT: 7 U/L (ref 0–35)
AST: 9 U/L (ref 0–37)
Albumin: 3.7 g/dL (ref 3.5–5.2)
Alkaline Phosphatase: 70 U/L (ref 39–117)
Bilirubin, Direct: 0.1 mg/dL (ref 0.0–0.3)
Total Bilirubin: 0.5 mg/dL (ref 0.2–1.2)
Total Protein: 6.7 g/dL (ref 6.0–8.3)

## 2023-09-12 LAB — MICROALBUMIN / CREATININE URINE RATIO
Creatinine,U: 72.8 mg/dL
Microalb Creat Ratio: 127.2 mg/g — ABNORMAL HIGH (ref 0.0–30.0)
Microalb, Ur: 9.3 mg/dL — ABNORMAL HIGH (ref 0.0–1.9)

## 2023-09-12 LAB — BASIC METABOLIC PANEL
BUN: 12 mg/dL (ref 6–23)
CO2: 30 meq/L (ref 19–32)
Calcium: 8.8 mg/dL (ref 8.4–10.5)
Chloride: 103 meq/L (ref 96–112)
Creatinine, Ser: 1.42 mg/dL — ABNORMAL HIGH (ref 0.40–1.20)
GFR: 34.59 mL/min — ABNORMAL LOW (ref >60.00)
Glucose, Bld: 167 mg/dL — ABNORMAL HIGH (ref 70–99)
Potassium: 4.6 meq/L (ref 3.5–5.1)
Sodium: 140 meq/L (ref 135–145)

## 2023-09-12 LAB — LIPID PANEL
Cholesterol: 147 mg/dL (ref 0–200)
HDL: 49.4 mg/dL (ref >39.00)
LDL Cholesterol: 77 mg/dL (ref 0–99)
NonHDL: 97.45
Total CHOL/HDL Ratio: 3
Triglycerides: 101 mg/dL (ref 0.0–149.0)
VLDL: 20.2 mg/dL (ref 0.0–40.0)

## 2023-09-12 LAB — HEMOGLOBIN A1C: Hgb A1c MFr Bld: 9.2 % — ABNORMAL HIGH (ref 4.6–6.5)

## 2023-09-12 MED ORDER — SOTALOL HCL 80 MG PO TABS: 80.0000 mg | ORAL_TABLET | Freq: Every day | ORAL | 1 refills | Status: DC

## 2023-09-12 NOTE — Telephone Encounter (Signed)
 Rx sent in for sotalol. D/w her at appt. Saw cardiology. Recommended to continue sotalol.

## 2023-09-15 ENCOUNTER — Ambulatory Visit (INDEPENDENT_AMBULATORY_CARE_PROVIDER_SITE_OTHER): Payer: Medicare HMO | Admitting: Internal Medicine

## 2023-09-15 VITALS — BP 130/70 | HR 90 | Temp 98.0°F | Resp 16 | Ht 65.0 in | Wt 250.0 lb

## 2023-09-15 DIAGNOSIS — E559 Vitamin D deficiency, unspecified: Secondary | ICD-10-CM

## 2023-09-15 DIAGNOSIS — E119 Type 2 diabetes mellitus without complications: Secondary | ICD-10-CM

## 2023-09-15 DIAGNOSIS — I10 Essential (primary) hypertension: Secondary | ICD-10-CM

## 2023-09-15 DIAGNOSIS — E785 Hyperlipidemia, unspecified: Secondary | ICD-10-CM | POA: Diagnosis not present

## 2023-09-15 DIAGNOSIS — E1165 Type 2 diabetes mellitus with hyperglycemia: Secondary | ICD-10-CM

## 2023-09-15 DIAGNOSIS — Z8673 Personal history of transient ischemic attack (TIA), and cerebral infarction without residual deficits: Secondary | ICD-10-CM | POA: Diagnosis not present

## 2023-09-15 DIAGNOSIS — E042 Nontoxic multinodular goiter: Secondary | ICD-10-CM

## 2023-09-15 DIAGNOSIS — I779 Disorder of arteries and arterioles, unspecified: Secondary | ICD-10-CM

## 2023-09-15 DIAGNOSIS — N1832 Chronic kidney disease, stage 3b: Secondary | ICD-10-CM

## 2023-09-15 DIAGNOSIS — Z794 Long term (current) use of insulin: Secondary | ICD-10-CM

## 2023-09-15 DIAGNOSIS — E1129 Type 2 diabetes mellitus with other diabetic kidney complication: Secondary | ICD-10-CM | POA: Diagnosis not present

## 2023-09-15 DIAGNOSIS — I4891 Unspecified atrial fibrillation: Secondary | ICD-10-CM

## 2023-09-15 DIAGNOSIS — I700 Atherosclerosis of aorta: Secondary | ICD-10-CM

## 2023-09-15 DIAGNOSIS — N183 Chronic kidney disease, stage 3 unspecified: Secondary | ICD-10-CM | POA: Insufficient documentation

## 2023-09-15 NOTE — Progress Notes (Signed)
 Subjective:    Patient ID: Tanya Aguirre, female    DOB: 09-Oct-1940, 83 y.o.   MRN: [id number]  Patient here for  Chief Complaint  Patient presents with   Medical Management of Chronic Issues    HPI Here for a scheduled follow up - follow up regarding hypercholesterolemia, diabetes, afib (on sotalol and cardizem) and hypertension. CVA in 2021. Seeing endocrinology now. Evaluated 07/27/23 - instructed to take toujeo 20 units q hs, with instructions for titration. Had f/u 09/06/23 - fasting sugars improved.  A1c 9.4. taking 30 units toujeo. Had f/u with cardiology 09/06/23 - stress test and ECHO - ok.  Calcium score ordered. Recommended to continue eliquis, sotolol and diltiazem.  Discussed recent labs.    Past Medical History:  Diagnosis Date   A-fib (HCC)    Allergy    Arthritis    Asthma    Diabetes mellitus without complication (HCC)    Hyperlipidemia    Hypertension    Stroke [facility])    Past Surgical History:  Procedure Laterality Date   BREAST LUMPECTOMY Right    Family History  Problem Relation Age of Onset   Stroke Mother    Hypertension Mother    Diabetes Mother    Heart attack Father    Heart attack Sister    Kidney disease Sister    Hypertension Sister    Diabetes Brother    Hypertension Brother    Diabetes Mellitus II Brother    Stroke Sister    Hypertension Sister    Diabetes Sister    Hypertension Brother    Diabetes Brother    Hypertension Sister    Diabetes Sister    Arthritis Sister    Social History   Socioeconomic History   Marital status: Married    Spouse name: Not on file   Number of children: Not on file   Years of education: Not on file   Highest education level: Not on file  Occupational History   Not on file  Tobacco Use   Smoking status: Never   Smokeless tobacco: Never  Substance and Sexual Activity   Alcohol use: Never   Drug use: Never   Sexual activity: Not on file  Other Topics Concern   Not on file  Social History  Narrative   Not on file   Social Drivers of Health   Financial Resource Strain: Not on file  Food Insecurity: Not on file  Transportation Needs: Not on file  Physical Activity: Not on file  Stress: Not on file  Social Connections: Not on file     Review of Systems  Constitutional:  Negative for appetite change and unexpected weight change.  HENT:  Negative for congestion and sinus pressure.   Respiratory:  Negative for cough, chest tightness and shortness of breath.   Cardiovascular:  Negative for chest pain and palpitations.  Gastrointestinal:  Negative for abdominal pain, diarrhea, nausea and vomiting.  Genitourinary:  Negative for difficulty urinating and dysuria.  Musculoskeletal:  Negative for joint swelling and myalgias.       Discussed - strengthening legs.   Skin:  Negative for color change and rash.  Neurological:  Negative for dizziness and headaches.  Psychiatric/Behavioral:  Negative for agitation and dysphoric mood.        Objective:     BP 130/70   Pulse 90   Temp 98 F (36.7 C)   Resp 16   Ht 5\' 5"  (1.651 m)   Wt 250 lb (113.4  kg)   SpO2 97%   BMI 41.60 kg/m  Wt Readings from Last 3 Encounters:  09/15/23 250 lb (113.4 kg)  07/18/23 250 lb 3.2 oz (113.5 kg)  05/17/23 246 lb (111.6 kg)    Physical Exam Vitals reviewed.  Constitutional:      General: She is not in acute distress.    Appearance: Normal appearance.  HENT:     Head: Normocephalic and atraumatic.     Right Ear: External ear normal.     Left Ear: External ear normal.     Mouth/Throat:     Pharynx: No oropharyngeal exudate or posterior oropharyngeal erythema.  Eyes:     General: No scleral icterus.       Right eye: No discharge.        Left eye: No discharge.     Conjunctiva/sclera: Conjunctivae normal.  Neck:     Thyroid: No thyromegaly.  Cardiovascular:     Rate and Rhythm: Normal rate and regular rhythm.  Pulmonary:     Effort: No respiratory distress.     Breath sounds:  Normal breath sounds. No wheezing.  Abdominal:     General: Bowel sounds are normal.     Palpations: Abdomen is soft.     Tenderness: There is no abdominal tenderness.  Musculoskeletal:        General: No swelling or tenderness.     Cervical back: Neck supple. No tenderness.  Lymphadenopathy:     Cervical: No cervical adenopathy.  Skin:    Findings: No erythema or rash.  Neurological:     Mental Status: She is alert.  Psychiatric:        Mood and Affect: Mood normal.        Behavior: Behavior normal.         Outpatient Encounter Medications as of 09/15/2023  Medication Sig   ACCU-CHEK AVIVA PLUS test strip 1 each 2 (two) times daily.   Accu-Chek FastClix Lancets MISC Apply 1 each topically 2 (two) times daily.   apixaban (ELIQUIS) 5 MG TABS tablet Take 1 tablet (5 mg total) by mouth 2 (two) times daily.   diltiazem (CARDIZEM) 120 MG tablet Take 120 mg by mouth at bedtime.   DROPLET PEN NEEDLES 31G X 5 MM MISC    furosemide (LASIX) 40 MG tablet Take 1 tablet (40 mg total) by mouth daily.   potassium chloride SA (KLOR-CON) 20 MEQ tablet Take 20 mEq by mouth daily.   simvastatin (ZOCOR) 20 MG tablet Take 20 mg by mouth at bedtime.   sotalol (BETAPACE) 80 MG tablet Take 1 tablet (80 mg total) by mouth daily.   TOUJEO SOLOSTAR 300 UNIT/ML Solostar Pen Inject 20 Units into the skin daily.   Vitamin D, Ergocalciferol, (DRISDOL) 1.25 MG (50000 UNIT) CAPS capsule Take 50,000 Units by mouth once a week.   No facility-administered encounter medications on file as of 09/15/2023.     Lab Results  Component Value Date   WBC 6.0 05/31/2023   HGB 13.1 05/31/2023   HCT 40.5 05/31/2023   PLT 209.0 05/31/2023   GLUCOSE 167 (H) 09/12/2023   CHOL 147 09/12/2023   TRIG 101.0 09/12/2023   HDL 49.40 09/12/2023   LDLCALC 77 09/12/2023   ALT 7 09/12/2023   AST 9 09/12/2023   NA 140 09/12/2023   K 4.6 09/12/2023   CL 103 09/12/2023   CREATININE 1.42 (H) 09/12/2023   BUN 12 09/12/2023   CO2  30 09/12/2023   TSH 2.29 05/31/2023  HGBA1C 9.2 (H) 09/12/2023   MICROALBUR 9.3 (H) 09/12/2023    CT ANGIO HEAD W OR WO CONTRAST Result Date: 11/30/2019 CLINICAL DATA:  Stroke on MRI, multiple lung nodules EXAM: CT ANGIOGRAPHY HEAD TECHNIQUE: Multidetector CT imaging of the head was performed using the standard protocol during bolus administration of intravenous contrast. Multiplanar CT image reconstructions and MIPs were obtained to evaluate the vascular anatomy. CONTRAST:  OMNIPAQUE IOHEXOL 350 MG/ML SOLN COMPARISON:  None. FINDINGS: CT HEAD Brain: There is no acute intracranial hemorrhage. More well-defined hypoattenuation is present in the right cerebellar hemisphere consistent with evolving acute infarction. The small left cerebellar infarct is better seen on MRI. There remains no significant mass effect in the posterior fossa or hydrocephalus. Chronic inferior right frontal infarct. No new loss of gray-white differentiation. Vascular: There is intracranial atherosclerotic calcification at the skull base. Skull: Unremarkable. Sinuses: Visualized portions are aerated. Orbits: Visualized portions are unremarkable. CTA HEAD Anterior circulation: Intracranial internal carotid arteries patent with calcified plaque causing mild stenosis. Anterior and middle cerebral arteries are patent. Posterior circulation: Intracranial vertebral arteries, basilar artery, and posterior cerebral arteries are patent. Right PICA origin is not well seen. AICA origins are patent. Right superior cerebellar artery origin not well seen and there appears to be diminished flow within this vessel. There are bilateral posterior communicating arteries. There is a 2 mm inferiorly directed outpouching at the right PCOM origin. The vessel may rise eccentrically rather than at the apex of this outpouching and aneurysm cannot be excluded. Venous sinuses: Not well evaluated on this study. IMPRESSION: Evolving acute right cerebellar  infarct. Small left cerebellar infarct better seen on MRI. There remains no significant mass effect or hydrocephalus. No acute intracranial hemorrhage. Right PICA and SCA origins are not well seen. 2 mm infundibulum or aneurysm at the right posterior communicating artery origin. Electronically Signed   By: Guadlupe Spanish M.D.   On: 11/30/2019 15:11   CT CHEST W CONTRAST Result Date: 11/30/2019 CLINICAL DATA:  Nodular densities on recent CT abdomen examination EXAM: CT CHEST WITH CONTRAST TECHNIQUE: Multidetector CT imaging of the chest was performed during intravenous contrast administration. CONTRAST:  OMNIPAQUE IOHEXOL 350 MG/ML SOLN COMPARISON:  100 mL Omnipaque 350. FINDINGS: Cardiovascular: Atherosclerotic calcifications of the thoracic aorta are noted. No aneurysmal dilatation or dissection is seen. Truncus anomaly is noted. Pulmonary artery as visualized is within normal limits. No significant coronary calcifications are noted. Heart is at the upper limits of normal in size. Mediastinum/Nodes: Thoracic inlet is within normal limits. No hilar or mediastinal adenopathy is noted. The esophagus is within normal limits. Lungs/Pleura: Lungs are well aerated bilaterally. Multiple bilateral subcentimeter nodules are noted throughout both lungs again suspicious for metastatic disease. No dominant nodule is seen. No sizable effusion is noted. Upper Abdomen: Upper abdomen demonstrates cholelithiasis without complicating factors. Normal excretion of contrast is noted from the kidneys. No other focal abnormality is seen. Musculoskeletal: Degenerative changes of the thoracic spine are noted. No lytic or sclerotic bony lesion is seen. IMPRESSION: Multiple subcentimeter sub solid nodules are seen highly suspicious for metastatic disease. Tissue sampling may be helpful. Cholelithiasis without complicating factors. Aortic Atherosclerosis (ICD10-I70.0). Electronically Signed   By: Alcide Clever M.D.   On: 11/30/2019  15:09   ECHOCARDIOGRAM COMPLETE Result Date: 11/30/2019    ECHOCARDIOGRAM REPORT   Patient Name:   [facility] Date of Exam: 11/30/2019 Medical Rec #:  [id number]         Height:  65.0 in Accession #:    [id number]        Weight:       238.4 lb Date of Birth:  1940-09-30          BSA:          2.131 m Patient Age:    79 years          BP:           139/53 mmHg Patient Gender: F                 HR:           68 bpm. Exam Location:  [facility] Procedure: 2D Echo, Color Doppler and Cardiac Doppler Indications:     I163.9 Stroke  History:         Patient has no prior history of Echocardiogram examinations.                  Arrythmias:Atrial Fibrillation; Risk Factors:Hypertension and                  Diabetes.  Sonographer:     Humphrey Rolls RDCS (AE) Referring Phys:  [id number] Brien Few NIU Diagnosing Phys: Julien Nordmann MD  Sonographer Comments: Suboptimal subcostal window. IMPRESSIONS  1. Left ventricular ejection fraction, by estimation, is 60 to 65%. The left ventricle has normal function. The left ventricle has no regional wall motion abnormalities. Left ventricular diastolic parameters are indeterminate.  2. Right ventricular systolic function is normal. The right ventricular size is normal.  3. Left atrial size was mildly dilated.  4. The mitral valve is normal in structure. Mild mitral valve regurgitation.  5. Rhythm is atrial fibrillation FINDINGS  Left Ventricle: Left ventricular ejection fraction, by estimation, is 60 to 65%. The left ventricle has normal function. The left ventricle has no regional wall motion abnormalities. The left ventricular internal cavity size was normal in size. There is  no left ventricular hypertrophy. Left ventricular diastolic parameters are indeterminate. Right Ventricle: The right ventricular size is normal. No increase in right ventricular wall thickness. Right ventricular systolic function is normal. Left Atrium: Left atrial size was mildly dilated. Right Atrium: Right atrial size  was normal in size. Pericardium: There is no evidence of pericardial effusion. Mitral Valve: The mitral valve is normal in structure. Normal mobility of the mitral valve leaflets. Mild mitral valve regurgitation. No evidence of mitral valve stenosis. MV peak gradient, 5.3 mmHg. The mean mitral valve gradient is 1.0 mmHg. Tricuspid Valve: The tricuspid valve is normal in structure. Tricuspid valve regurgitation is not demonstrated. No evidence of tricuspid stenosis. Aortic Valve: The aortic valve is normal in structure. Aortic valve regurgitation is not visualized. No aortic stenosis is present. Aortic valve mean gradient measures 2.0 mmHg. Aortic valve peak gradient measures 4.7 mmHg. Aortic valve area, by VTI measures 3.13 cm. Pulmonic Valve: The pulmonic valve was normal in structure. Pulmonic valve regurgitation is not visualized. No evidence of pulmonic stenosis. Aorta: The aortic root is normal in size and structure. Venous: The inferior vena cava is normal in size with greater than 50% respiratory variability, suggesting right atrial pressure of 3 mmHg. IAS/Shunts: No atrial level shunt detected by color flow Doppler.  LEFT VENTRICLE PLAX 2D LVIDd:         4.35 cm  Diastology LVIDs:         2.39 cm  LV e' lateral:   11.50 cm/s LV PW:         0.92  cm  LV E/e' lateral: 9.6 LV IVS:        0.78 cm  LV e' medial:    8.05 cm/s LVOT diam:     2.20 cm  LV E/e' medial:  13.7 LV SV:         65 LV SV Index:   30 LVOT Area:     3.80 cm  LEFT ATRIUM             Index LA diam:        3.80 cm 1.78 cm/m LA Vol (A2C):   51.0 ml 23.93 ml/m LA Vol (A4C):   70.6 ml 33.12 ml/m LA Biplane Vol: 62.8 ml 29.46 ml/m  AORTIC VALVE                   PULMONIC VALVE AV Area (Vmax):    2.91 cm    PV Vmax:       0.73 m/s AV Area (Vmean):   3.12 cm    PV Vmean:      50.500 cm/s AV Area (VTI):     3.13 cm    PV VTI:        0.122 m AV Vmax:           108.00 cm/s PV Peak grad:  2.1 mmHg AV Vmean:          69.100 cm/s PV Mean grad:  1.0  mmHg AV VTI:            0.208 m AV Peak Grad:      4.7 mmHg AV Mean Grad:      2.0 mmHg LVOT Vmax:         82.80 cm/s LVOT Vmean:        56.700 cm/s LVOT VTI:          0.171 m LVOT/AV VTI ratio: 0.82  AORTA Ao Root diam: 2.70 cm MITRAL VALVE MV Area (PHT): 4.59 cm     SHUNTS MV Peak grad:  5.3 mmHg     Systemic VTI:  0.17 m MV Mean grad:  1.0 mmHg     Systemic Diam: 2.20 cm MV Vmax:       1.15 m/s MV Vmean:      51.2 cm/s MV Decel Time: 165 msec MV E velocity: 110.33 cm/s MV A velocity: 49.50 cm/s MV E/A ratio:  2.23 Julien Nordmann MD Electronically signed by Julien Nordmann MD Signature Date/Time: 11/30/2019/1:28:47 PM    Final        Assessment & Plan:  Stage 3b chronic kidney disease (HCC) Assessment & Plan: Decreased GFR on recent check. Discussed the need to avoid antiinflammatory medication. Stay hydrated. Recheck met b and urinalysis soon. Consider nephrology referral.  Discuss with cardiology - question of changing given decrease in GFR.   Orders: -     Basic metabolic panel; Future -     Urinalysis, Routine w reflex microscopic; Future  Primary hypertension Assessment & Plan: Currently on diltiazem and betapace. Blood pressure as outlined. Just saw cardiology. Recommended to continue current medications. No changes at this time. Follow pressures.   Orders: -     Basic metabolic panel; Future  Hyperlipidemia, unspecified hyperlipidemia type Assessment & Plan: Continues on simvastatin. Low cholesterol diet and exercise. Follow lipid panel and liver function tests.    Diabetes mellitus without complication (HCC)  Vitamin D deficiency Assessment & Plan: Documented history of vitamin D def.  Follow vitamin D level. Check with next labs.   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 Evaluated 07/27/23 - instructed to take toujeo 20  units q hs, with instructions for titration. Had f/u 09/06/23 - fasting sugars improved.  A1c 9.4. taking 30 units toujeo.  Continue low carb diet and exercise. Follow met b and A1c.    Multinodular goiter Assessment & Plan: S/p FNA 11/2022 - negative for malignancy.    History of CVA (cerebrovascular accident) Assessment & Plan: History of cerebral infarction posterior circulation brain stem. Continue eliquis and statin.    Bilateral carotid artery disease, unspecified type (HCC) Assessment & Plan: Mild stenosis of the right proximal internal carotid artery (<50% stenosis in the range of 16-49%). Mild stenosis of the left ICA (<50% in the range of 16-49%) - 07/22/22. Continue statin medication and on eliquis. Follow.    Atrial fibrillation, unspecified type [facility]) Assessment & Plan: Has been followed by Dr Kerrie Pleasure. Has been on eliquis, betapace and diltiazem. No increased heart rate or palpitations reported. Breathing stable. No chest pain. Saw cardiology recently. Recommended to continue diltiazem and sotalol. D/w cardiology - possible change in sotalol as outlined.    Aortic atherosclerosis (HCC) Assessment & Plan: Continue simvastatin.       Dale [place], MD

## 2023-09-18 ENCOUNTER — Encounter: Payer: Self-pay | Admitting: Internal Medicine

## 2023-09-18 NOTE — Assessment & Plan Note (Signed)
 Decreased GFR on recent check. Discussed the need to avoid antiinflammatory medication. Stay hydrated. Recheck met b and urinalysis soon. Consider nephrology referral.  Discuss with cardiology - question of changing given decrease in GFR.

## 2023-09-18 NOTE — Assessment & Plan Note (Signed)
 Seeing endocrinology now. Evaluated 07/27/23 - instructed to take toujeo 20 units q hs, with instructions for titration. Had f/u 09/06/23 - fasting sugars improved.  A1c 9.4. taking 30 units toujeo.  Continue low carb diet and exercise. Follow met b and A1c.

## 2023-09-18 NOTE — Assessment & Plan Note (Signed)
 Continue simvastatin.

## 2023-09-18 NOTE — Assessment & Plan Note (Signed)
 S/p FNA 11/2022 - negative for malignancy.

## 2023-09-18 NOTE — Assessment & Plan Note (Signed)
 Mild stenosis of the right proximal internal carotid artery (<50% stenosis in the range of 16-49%). Mild stenosis of the left ICA (<50% in the range of 16-49%) - 07/22/22. Continue statin medication and on eliquis. Follow.

## 2023-09-18 NOTE — Assessment & Plan Note (Signed)
 History of cerebral infarction posterior circulation brain stem. Continue eliquis and statin.

## 2023-09-18 NOTE — Assessment & Plan Note (Signed)
 Continues on simvastatin. Low cholesterol diet and exercise. Follow lipid panel and liver function tests.

## 2023-09-18 NOTE — Assessment & Plan Note (Signed)
 Currently on diltiazem and betapace. Blood pressure as outlined. Just saw cardiology. Recommended to continue current medications. No changes at this time. Follow pressures.

## 2023-09-18 NOTE — Assessment & Plan Note (Signed)
 Has been followed by Dr Kerrie Pleasure. Has been on eliquis, betapace and diltiazem. No increased heart rate or palpitations reported. Breathing stable. No chest pain. Saw cardiology recently. Recommended to continue diltiazem and sotalol. D/w cardiology - possible change in sotalol as outlined.

## 2023-09-18 NOTE — Assessment & Plan Note (Signed)
 Documented history of vitamin D def.  Follow vitamin D level. Check with next labs.

## 2023-09-23 ENCOUNTER — Telehealth: Payer: Self-pay

## 2023-09-23 NOTE — Telephone Encounter (Signed)
 Spoke to pt and informed her that she can come pick up her patient assistance medication Solostar 3 boxes anytime between 9-5 pm M-F

## 2023-09-26 ENCOUNTER — Other Ambulatory Visit (INDEPENDENT_AMBULATORY_CARE_PROVIDER_SITE_OTHER)

## 2023-09-26 DIAGNOSIS — E559 Vitamin D deficiency, unspecified: Secondary | ICD-10-CM

## 2023-09-26 DIAGNOSIS — I10 Essential (primary) hypertension: Secondary | ICD-10-CM | POA: Diagnosis not present

## 2023-09-26 DIAGNOSIS — N1832 Chronic kidney disease, stage 3b: Secondary | ICD-10-CM

## 2023-09-26 LAB — BASIC METABOLIC PANEL
BUN: 17 mg/dL (ref 6–23)
CO2: 27 meq/L (ref 19–32)
Calcium: 9.2 mg/dL (ref 8.4–10.5)
Chloride: 101 meq/L (ref 96–112)
Creatinine, Ser: 1.23 mg/dL — ABNORMAL HIGH (ref 0.40–1.20)
GFR: 41.09 mL/min — ABNORMAL LOW (ref >60.00)
Glucose, Bld: 182 mg/dL — ABNORMAL HIGH (ref 70–99)
Potassium: 4.5 meq/L (ref 3.5–5.1)
Sodium: 137 meq/L (ref 135–145)

## 2023-09-26 LAB — URINALYSIS, ROUTINE W REFLEX MICROSCOPIC
Bilirubin Urine: NEGATIVE
Ketones, ur: NEGATIVE
Nitrite: NEGATIVE
RBC / HPF: NONE SEEN (ref 0–?)
Specific Gravity, Urine: 1.01 (ref 1.000–1.030)
Total Protein, Urine: NEGATIVE
Urine Glucose: NEGATIVE
Urobilinogen, UA: 0.2 (ref 0.0–1.0)
pH: 7.5 (ref 5.0–8.0)

## 2023-09-26 LAB — VITAMIN D 25 HYDROXY (VIT D DEFICIENCY, FRACTURES): VITD: 37.72 ng/mL (ref 30.00–100.00)

## 2023-09-26 NOTE — Telephone Encounter (Signed)
 Pt picked up Toujeo pt assistance medication this morning

## 2023-09-28 ENCOUNTER — Encounter: Payer: Self-pay | Admitting: Pharmacist

## 2023-10-04 ENCOUNTER — Encounter: Payer: Self-pay | Admitting: Pharmacist

## 2023-10-04 NOTE — Progress Notes (Addendum)
 Patient Assistance Program (PAP) Application   Manufacturer: [employer] ([employer])  Medication(s): Jardiance 25 mg tablet   Managed by [facility]  HCP Pages previously faxed to [place]. Physical copy also provided to patient previously and she reports giving this to endocrinology provider.   10/04/23 Called [employer] today to ensure enrollment for 2025.  [employer] reports provider pages have not been received.  Full application (completed/signed patient pages + blank HCP pages + insurance card) faxed to [facility] again on 10/04/23 (Fax: [phone]) - referred to Elisha Ponder  10/14/23 Income documentation received in front office.  Faxed to [facility]  Loree Fee, PharmD Clinical Pharmacist [facility] [phone]

## 2023-10-14 ENCOUNTER — Encounter: Payer: Self-pay | Admitting: Pharmacist

## 2023-10-14 NOTE — Progress Notes (Signed)
 Brief Telephone Documentation Reason for Call: Patient called to inform me that her husband has dropped off income documentation at the front office for her [facility] Cares application   Summary of Call: Forward to Admin pool to ensure this was received. No documentation yet.   Follow Up: Patient given direct line for further questions/concerns.  Loree Fee, PharmD Clinical Pharmacist [facility] [phone]

## 2023-10-14 NOTE — Progress Notes (Signed)
 I faxed patient's income information to [place] at [employer].  Rodman Pickle states that we should include "Proof of Income" and patient's name and date of birth on fax cover sheet, so I did.  I spoke with Rodman Pickle to verify that fax was received and she states it will not update in patient's file for 24-48 hours.  I asked her to please let us know if this information does not come through on her end.  Rodman Pickle states she will.

## 2023-11-02 DIAGNOSIS — I10 Essential (primary) hypertension: Secondary | ICD-10-CM | POA: Diagnosis not present

## 2023-11-02 DIAGNOSIS — E66813 Obesity, class 3: Secondary | ICD-10-CM | POA: Diagnosis not present

## 2023-11-02 DIAGNOSIS — E782 Mixed hyperlipidemia: Secondary | ICD-10-CM | POA: Diagnosis not present

## 2023-11-02 DIAGNOSIS — I4891 Unspecified atrial fibrillation: Secondary | ICD-10-CM | POA: Diagnosis not present

## 2023-11-02 DIAGNOSIS — E119 Type 2 diabetes mellitus without complications: Secondary | ICD-10-CM | POA: Diagnosis not present

## 2023-11-02 DIAGNOSIS — Z8673 Personal history of transient ischemic attack (TIA), and cerebral infarction without residual deficits: Secondary | ICD-10-CM | POA: Diagnosis not present

## 2023-11-02 DIAGNOSIS — Z6841 Body Mass Index (BMI) 40.0 and over, adult: Secondary | ICD-10-CM | POA: Diagnosis not present

## 2023-11-18 ENCOUNTER — Ambulatory Visit (INDEPENDENT_AMBULATORY_CARE_PROVIDER_SITE_OTHER): Admitting: Internal Medicine

## 2023-11-18 ENCOUNTER — Encounter: Payer: Self-pay | Admitting: Internal Medicine

## 2023-11-18 VITALS — BP 144/78 | HR 110 | Ht 65.0 in | Wt 254.0 lb

## 2023-11-18 DIAGNOSIS — N1832 Chronic kidney disease, stage 3b: Secondary | ICD-10-CM

## 2023-11-18 DIAGNOSIS — I4891 Unspecified atrial fibrillation: Secondary | ICD-10-CM | POA: Diagnosis not present

## 2023-11-18 DIAGNOSIS — E785 Hyperlipidemia, unspecified: Secondary | ICD-10-CM | POA: Diagnosis not present

## 2023-11-18 DIAGNOSIS — E119 Type 2 diabetes mellitus without complications: Secondary | ICD-10-CM | POA: Diagnosis not present

## 2023-11-18 DIAGNOSIS — I10 Essential (primary) hypertension: Secondary | ICD-10-CM | POA: Diagnosis not present

## 2023-11-18 DIAGNOSIS — Z794 Long term (current) use of insulin: Secondary | ICD-10-CM | POA: Diagnosis not present

## 2023-11-18 DIAGNOSIS — Z8673 Personal history of transient ischemic attack (TIA), and cerebral infarction without residual deficits: Secondary | ICD-10-CM | POA: Diagnosis not present

## 2023-11-18 DIAGNOSIS — I700 Atherosclerosis of aorta: Secondary | ICD-10-CM | POA: Diagnosis not present

## 2023-11-18 DIAGNOSIS — I779 Disorder of arteries and arterioles, unspecified: Secondary | ICD-10-CM

## 2023-11-18 LAB — CBC WITH DIFFERENTIAL/PLATELET
Basophils Absolute: 0 10*3/uL (ref 0.0–0.1)
Basophils Relative: 0.9 % (ref 0.0–3.0)
Eosinophils Absolute: 0.4 10*3/uL (ref 0.0–0.7)
Eosinophils Relative: 6.8 % — ABNORMAL HIGH (ref 0.0–5.0)
HCT: 40.9 % (ref 36.0–46.0)
Hemoglobin: 13.5 g/dL (ref 12.0–15.0)
Lymphocytes Relative: 31 % (ref 12.0–46.0)
Lymphs Abs: 1.8 10*3/uL (ref 0.7–4.0)
MCHC: 33 g/dL (ref 30.0–36.0)
MCV: 97.7 fl (ref 78.0–100.0)
Monocytes Absolute: 0.8 10*3/uL (ref 0.1–1.0)
Monocytes Relative: 13.5 % — ABNORMAL HIGH (ref 3.0–12.0)
Neutro Abs: 2.7 10*3/uL (ref 1.4–7.7)
Neutrophils Relative %: 47.8 % (ref 43.0–77.0)
Platelets: 200 10*3/uL (ref 150.0–400.0)
RBC: 4.18 Mil/uL (ref 3.87–5.11)
RDW: 13.3 % (ref 11.5–15.5)
WBC: 5.7 10*3/uL (ref 4.0–10.5)

## 2023-11-18 LAB — BASIC METABOLIC PANEL WITH GFR
BUN: 13 mg/dL (ref 6–23)
CO2: 29 meq/L (ref 19–32)
Calcium: 9.1 mg/dL (ref 8.4–10.5)
Chloride: 100 meq/L (ref 96–112)
Creatinine, Ser: 1.39 mg/dL — ABNORMAL HIGH (ref 0.40–1.20)
GFR: 35.44 mL/min — ABNORMAL LOW (ref >60.00)
Glucose, Bld: 172 mg/dL — ABNORMAL HIGH (ref 70–99)
Potassium: 4.1 meq/L (ref 3.5–5.1)
Sodium: 138 meq/L (ref 135–145)

## 2023-11-18 LAB — HEPATIC FUNCTION PANEL
ALT: 11 U/L (ref 0–35)
AST: 14 U/L (ref 0–37)
Albumin: 3.9 g/dL (ref 3.5–5.2)
Alkaline Phosphatase: 83 U/L (ref 39–117)
Bilirubin, Direct: 0 mg/dL (ref 0.0–0.3)
Total Bilirubin: 0.6 mg/dL (ref 0.2–1.2)
Total Protein: 7.3 g/dL (ref 6.0–8.3)

## 2023-11-18 LAB — TSH: TSH: 3.59 u[IU]/mL (ref 0.35–5.50)

## 2023-11-18 NOTE — Progress Notes (Unsigned)
 Subjective:    Patient ID: Tanya Aguirre, female    DOB: 06-14-1941, 83 y.o.   MRN: [id number]  Patient here for  Chief Complaint  Patient presents with  . Medical Management of Chronic Issues    2 month follow up     HPI Here for a scheduled follow up - follow up regarding hypercholesterolemia, diabetes, afib (on sotalol  and cardizem ) and hypertension. CVA in 2021. Seeing endocrinology now. Evaluated 07/27/23 - instructed to take toujeo  20 units q hs, with instructions for titration. Had f/u with cardiology 11/02/23. F/u regarding afib. ECHO 08/25/23 - EF 55%, mild MR, mild PR, mild TR.  Myocardial perfusion test - normal myocardial thickening and wall motion. No evidence of ischemia. Continue eliquis  and diltiazem . Currently on amiodarone.    Past Medical History:  Diagnosis Date  . A-fib (HCC)   . Allergy   . Arthritis   . Asthma   . Diabetes mellitus without complication (HCC)   . Hyperlipidemia   . Hypertension   . Stroke [facility])    Past Surgical History:  Procedure Laterality Date  . BREAST LUMPECTOMY Right    Family History  Problem Relation Age of Onset  . Stroke Mother   . Hypertension Mother   . Diabetes Mother   . Heart attack Father   . Heart attack Sister   . Kidney disease Sister   . Hypertension Sister   . Diabetes Brother   . Hypertension Brother   . Diabetes Mellitus II Brother   . Stroke Sister   . Hypertension Sister   . Diabetes Sister   . Hypertension Brother   . Diabetes Brother   . Hypertension Sister   . Diabetes Sister   . Arthritis Sister    Social History   Socioeconomic History  . Marital status: Married    Spouse name: Not on file  . Number of children: Not on file  . Years of education: Not on file  . Highest education level: Not on file  Occupational History  . Not on file  Tobacco Use  . Smoking status: Never  . Smokeless tobacco: Never  Substance and Sexual Activity  . Alcohol use: Never  . Drug use: Never  . Sexual  activity: Not on file  Other Topics Concern  . Not on file  Social History Narrative  . Not on file   Social Drivers of Health   Financial Resource Strain: Not on file  Food Insecurity: Not on file  Transportation Needs: Not on file  Physical Activity: Not on file  Stress: Not on file  Social Connections: Not on file     Review of Systems     Objective:     BP (!) 172/76   Pulse (!) 110   Ht 5\' 5"  (1.651 m)   Wt 254 lb (115.2 kg)   SpO2 98%   BMI 42.27 kg/m  Wt Readings from Last 3 Encounters:  11/18/23 254 lb (115.2 kg)  09/15/23 250 lb (113.4 kg)  07/18/23 250 lb 3.2 oz (113.5 kg)    Physical Exam  {Perform Simple Foot Exam  Perform Detailed exam:1} {Insert foot Exam (Optional):30965}   Outpatient Encounter Medications as of 11/18/2023  Medication Sig  . ACCU-CHEK AVIVA PLUS test strip 1 each 2 (two) times daily.  . Accu-Chek FastClix Lancets MISC Apply 1 each topically 2 (two) times daily.  Aaron Aas amiodarone (PACERONE) 100 MG tablet Take 100 mg by mouth daily.  . apixaban  (ELIQUIS ) 5 MG TABS  tablet Take 1 tablet (5 mg total) by mouth 2 (two) times daily.  . diltiazem  (CARDIZEM  CD) 240 MG 24 hr capsule Take 240 mg by mouth daily.  . DROPLET PEN NEEDLES 31G X 5 MM MISC   . furosemide  (LASIX ) 40 MG tablet Take 1 tablet (40 mg total) by mouth daily.  . potassium chloride  SA (KLOR-CON ) 20 MEQ tablet Take 20 mEq by mouth daily.  . simvastatin  (ZOCOR ) 20 MG tablet Take 20 mg by mouth at bedtime.  . TOUJEO  SOLOSTAR 300 UNIT/ML Solostar Pen Inject 30 Units into the skin daily.  . Vitamin D , Ergocalciferol , (DRISDOL) 1.25 MG (50000 UNIT) CAPS capsule Take 50,000 Units by mouth once a week.  . [DISCONTINUED] diltiazem  (CARDIZEM ) 120 MG tablet Take 120 mg by mouth at bedtime. (Patient not taking: Reported on 11/18/2023)  . [DISCONTINUED] sotalol  (BETAPACE ) 80 MG tablet Take 1 tablet (80 mg total) by mouth daily. (Patient not taking: Reported on 11/18/2023)   No  facility-administered encounter medications on file as of 11/18/2023.     Lab Results  Component Value Date   WBC 6.0 05/31/2023   HGB 13.1 05/31/2023   HCT 40.5 05/31/2023   PLT 209.0 05/31/2023   GLUCOSE 182 (H) 09/26/2023   CHOL 147 09/12/2023   TRIG 101.0 09/12/2023   HDL 49.40 09/12/2023   LDLCALC 77 09/12/2023   ALT 7 09/12/2023   AST 9 09/12/2023   NA 137 09/26/2023   K 4.5 09/26/2023   CL 101 09/26/2023   CREATININE 1.23 (H) 09/26/2023   BUN 17 09/26/2023   CO2 27 09/26/2023   TSH 2.29 05/31/2023   HGBA1C 9.2 (H) 09/12/2023   MICROALBUR 9.3 (H) 09/12/2023    CT ANGIO HEAD W OR WO CONTRAST Result Date: 11/30/2019 CLINICAL DATA:  Stroke on MRI, multiple lung nodules EXAM: CT ANGIOGRAPHY HEAD TECHNIQUE: Multidetector CT imaging of the head was performed using the standard protocol during bolus administration of intravenous contrast. Multiplanar CT image reconstructions and MIPs were obtained to evaluate the vascular anatomy. CONTRAST:  OMNIPAQUE  IOHEXOL  350 MG/ML SOLN COMPARISON:  None. FINDINGS: CT HEAD Brain: There is no acute intracranial hemorrhage. More well-defined hypoattenuation is present in the right cerebellar hemisphere consistent with evolving acute infarction. The small left cerebellar infarct is better seen on MRI. There remains no significant mass effect in the posterior fossa or hydrocephalus. Chronic inferior right frontal infarct. No new loss of gray-white differentiation. Vascular: There is intracranial atherosclerotic calcification at the skull base. Skull: Unremarkable. Sinuses: Visualized portions are aerated. Orbits: Visualized portions are unremarkable. CTA HEAD Anterior circulation: Intracranial internal carotid arteries patent with calcified plaque causing mild stenosis. Anterior and middle cerebral arteries are patent. Posterior circulation: Intracranial vertebral arteries, basilar artery, and posterior cerebral arteries are patent. Right PICA origin  is not well seen. AICA origins are patent. Right superior cerebellar artery origin not well seen and there appears to be diminished flow within this vessel. There are bilateral posterior communicating arteries. There is a 2 mm inferiorly directed outpouching at the right PCOM origin. The vessel may rise eccentrically rather than at the apex of this outpouching and aneurysm cannot be excluded. Venous sinuses: Not well evaluated on this study. IMPRESSION: Evolving acute right cerebellar infarct. Small left cerebellar infarct better seen on MRI. There remains no significant mass effect or hydrocephalus. No acute intracranial hemorrhage. Right PICA and SCA origins are not well seen. 2 mm infundibulum or aneurysm at the right posterior communicating artery origin. Electronically Signed  By: Praneil  Patel M.D.   On: 11/30/2019 15:11   CT CHEST W CONTRAST Result Date: 11/30/2019 CLINICAL DATA:  Nodular densities on recent CT abdomen examination EXAM: CT CHEST WITH CONTRAST TECHNIQUE: Multidetector CT imaging of the chest was performed during intravenous contrast administration. CONTRAST:  OMNIPAQUE  IOHEXOL  350 MG/ML SOLN COMPARISON:  100 mL Omnipaque  350. FINDINGS: Cardiovascular: Atherosclerotic calcifications of the thoracic aorta are noted. No aneurysmal dilatation or dissection is seen. Truncus anomaly is noted. Pulmonary artery as visualized is within normal limits. No significant coronary calcifications are noted. Heart is at the upper limits of normal in size. Mediastinum/Nodes: Thoracic inlet is within normal limits. No hilar or mediastinal adenopathy is noted. The esophagus is within normal limits. Lungs/Pleura: Lungs are well aerated bilaterally. Multiple bilateral subcentimeter nodules are noted throughout both lungs again suspicious for metastatic disease. No dominant nodule is seen. No sizable effusion is noted. Upper Abdomen: Upper abdomen demonstrates cholelithiasis without complicating factors.  Normal excretion of contrast is noted from the kidneys. No other focal abnormality is seen. Musculoskeletal: Degenerative changes of the thoracic spine are noted. No lytic or sclerotic bony lesion is seen. IMPRESSION: Multiple subcentimeter sub solid nodules are seen highly suspicious for metastatic disease. Tissue sampling may be helpful. Cholelithiasis without complicating factors. Aortic Atherosclerosis (ICD10-I70.0). Electronically Signed   By: Violeta Grey M.D.   On: 11/30/2019 15:09   ECHOCARDIOGRAM COMPLETE Result Date: 11/30/2019    ECHOCARDIOGRAM REPORT   Patient Name:   Aayla ANN Daniele Date of Exam: 11/30/2019 Medical Rec #:  [id number]         Height:       65.0 in Accession #:    [id number]        Weight:       238.4 lb Date of Birth:  12/25/1940          BSA:          2.131 m Patient Age:    79 years          BP:           139/53 mmHg Patient Gender: F                 HR:           68 bpm. Exam Location:  [facility] Procedure: 2D Echo, Color Doppler and Cardiac Doppler Indications:     I163.9 Stroke  History:         Patient has no prior history of Echocardiogram examinations.                  Arrythmias:Atrial Fibrillation; Risk Factors:Hypertension and                  Diabetes.  Sonographer:     Bubba Carbo RDCS (AE) Referring Phys:  [id number] Lorel Roes NIU Diagnosing Phys: Belva Boyden MD  Sonographer Comments: Suboptimal subcostal window. IMPRESSIONS  1. Left ventricular ejection fraction, by estimation, is 60 to 65%. The left ventricle has normal function. The left ventricle has no regional wall motion abnormalities. Left ventricular diastolic parameters are indeterminate.  2. Right ventricular systolic function is normal. The right ventricular size is normal.  3. Left atrial size was mildly dilated.  4. The mitral valve is normal in structure. Mild mitral valve regurgitation.  5. Rhythm is atrial fibrillation FINDINGS  Left Ventricle: Left ventricular ejection fraction, by estimation, is 60 to 65%. The left  ventricle has normal function. The left ventricle has no regional wall  motion abnormalities. The left ventricular internal cavity size was normal in size. There is  no left ventricular hypertrophy. Left ventricular diastolic parameters are indeterminate. Right Ventricle: The right ventricular size is normal. No increase in right ventricular wall thickness. Right ventricular systolic function is normal. Left Atrium: Left atrial size was mildly dilated. Right Atrium: Right atrial size was normal in size. Pericardium: There is no evidence of pericardial effusion. Mitral Valve: The mitral valve is normal in structure. Normal mobility of the mitral valve leaflets. Mild mitral valve regurgitation. No evidence of mitral valve stenosis. MV peak gradient, 5.3 mmHg. The mean mitral valve gradient is 1.0 mmHg. Tricuspid Valve: The tricuspid valve is normal in structure. Tricuspid valve regurgitation is not demonstrated. No evidence of tricuspid stenosis. Aortic Valve: The aortic valve is normal in structure. Aortic valve regurgitation is not visualized. No aortic stenosis is present. Aortic valve mean gradient measures 2.0 mmHg. Aortic valve peak gradient measures 4.7 mmHg. Aortic valve area, by VTI measures 3.13 cm. Pulmonic Valve: The pulmonic valve was normal in structure. Pulmonic valve regurgitation is not visualized. No evidence of pulmonic stenosis. Aorta: The aortic root is normal in size and structure. Venous: The inferior vena cava is normal in size with greater than 50% respiratory variability, suggesting right atrial pressure of 3 mmHg. IAS/Shunts: No atrial level shunt detected by color flow Doppler.  LEFT VENTRICLE PLAX 2D LVIDd:         4.35 cm  Diastology LVIDs:         2.39 cm  LV e' lateral:   11.50 cm/s LV PW:         0.92 cm  LV E/e' lateral: 9.6 LV IVS:        0.78 cm  LV e' medial:    8.05 cm/s LVOT diam:     2.20 cm  LV E/e' medial:  13.7 LV SV:         65 LV SV Index:   30 LVOT Area:     3.80 cm  LEFT  ATRIUM             Index LA diam:        3.80 cm 1.78 cm/m LA Vol (A2C):   51.0 ml 23.93 ml/m LA Vol (A4C):   70.6 ml 33.12 ml/m LA Biplane Vol: 62.8 ml 29.46 ml/m  AORTIC VALVE                   PULMONIC VALVE AV Area (Vmax):    2.91 cm    PV Vmax:       0.73 m/s AV Area (Vmean):   3.12 cm    PV Vmean:      50.500 cm/s AV Area (VTI):     3.13 cm    PV VTI:        0.122 m AV Vmax:           108.00 cm/s PV Peak grad:  2.1 mmHg AV Vmean:          69.100 cm/s PV Mean grad:  1.0 mmHg AV VTI:            0.208 m AV Peak Grad:      4.7 mmHg AV Mean Grad:      2.0 mmHg LVOT Vmax:         82.80 cm/s LVOT Vmean:        56.700 cm/s LVOT VTI:          0.171 m LVOT/AV VTI  ratio: 0.82  AORTA Ao Root diam: 2.70 cm MITRAL VALVE MV Area (PHT): 4.59 cm     SHUNTS MV Peak grad:  5.3 mmHg     Systemic VTI:  0.17 m MV Mean grad:  1.0 mmHg     Systemic Diam: 2.20 cm MV Vmax:       1.15 m/s MV Vmean:      51.2 cm/s MV Decel Time: 165 msec MV E velocity: 110.33 cm/s MV A velocity: 49.50 cm/s MV E/A ratio:  2.23 Belva Boyden MD Electronically signed by Belva Boyden MD Signature Date/Time: 11/30/2019/1:28:47 PM    Final        Assessment & Plan:  Primary hypertension  Hyperlipidemia, unspecified hyperlipidemia type  Diabetes mellitus without complication (HCC)     Dellar Fenton, MD

## 2023-11-20 ENCOUNTER — Encounter: Payer: Self-pay | Admitting: Internal Medicine

## 2023-11-20 NOTE — Assessment & Plan Note (Signed)
 Continue simvastatin.

## 2023-11-20 NOTE — Assessment & Plan Note (Signed)
 Currently on diltiazem . Off sotalol  and now on amiodarone.  Blood pressure as outlined. Not tolerating amiodarone. Discussed with cardiology. Will stop amiodarone. Plan f/u with cardiology 11/21/23. Discussed elevated blood pressure. Discussed the possibility of starting low dose ARB (benicar 5mg  q day). Will need close monitoring of renal function, but given her diabetes, feel a trial of ARB would be beneficial. Discussed with Tanya Aguirre. She wants to hold on starting a new medication until she can talk to cardiology. Follow pressures.

## 2023-11-20 NOTE — Assessment & Plan Note (Signed)
 Mild stenosis of the right proximal internal carotid artery (<50% stenosis in the range of 16-49%). Mild stenosis of the left ICA (<50% in the range of 16-49%) - 07/22/22. Continue statin medication and on eliquis. Follow.

## 2023-11-20 NOTE — Assessment & Plan Note (Signed)
 History of cerebral infarction posterior circulation brain stem. Continue eliquis and statin.

## 2023-11-20 NOTE — Assessment & Plan Note (Signed)
 Continues on simvastatin. Low cholesterol diet and exercise. Follow lipid panel and liver function tests.

## 2023-11-20 NOTE — Assessment & Plan Note (Signed)
 Was followed by Dr Cecil Code. On eliquis  and diltiazem . Sotalol  was stopped and she was started on amiodarone. No increased heart rate or palpitations reported. Breathing stable. No chest pain. Not tolerating amiodarone as outlined. Discussed with cardiology. Recommended to stop amiodarone and f/u with Dr Beau Bound 11/21/23.

## 2023-11-20 NOTE — Assessment & Plan Note (Signed)
 Decreased GFR. Discussed the need to avoid antiinflammatory medication. Stay hydrated. Consider adding low dose ARB (benicar 5mg ).

## 2023-11-21 ENCOUNTER — Ambulatory Visit

## 2023-11-21 DIAGNOSIS — E782 Mixed hyperlipidemia: Secondary | ICD-10-CM | POA: Diagnosis not present

## 2023-11-21 DIAGNOSIS — Z6841 Body Mass Index (BMI) 40.0 and over, adult: Secondary | ICD-10-CM | POA: Diagnosis not present

## 2023-11-21 DIAGNOSIS — I4891 Unspecified atrial fibrillation: Secondary | ICD-10-CM | POA: Diagnosis not present

## 2023-11-21 DIAGNOSIS — Z8673 Personal history of transient ischemic attack (TIA), and cerebral infarction without residual deficits: Secondary | ICD-10-CM | POA: Diagnosis not present

## 2023-11-21 DIAGNOSIS — E119 Type 2 diabetes mellitus without complications: Secondary | ICD-10-CM | POA: Diagnosis not present

## 2023-11-21 DIAGNOSIS — E66813 Obesity, class 3: Secondary | ICD-10-CM | POA: Diagnosis not present

## 2023-11-21 DIAGNOSIS — I2510 Atherosclerotic heart disease of native coronary artery without angina pectoris: Secondary | ICD-10-CM | POA: Diagnosis not present

## 2023-11-21 DIAGNOSIS — I10 Essential (primary) hypertension: Secondary | ICD-10-CM | POA: Diagnosis not present

## 2023-11-25 DIAGNOSIS — I4891 Unspecified atrial fibrillation: Secondary | ICD-10-CM | POA: Diagnosis not present

## 2023-12-09 DIAGNOSIS — Z794 Long term (current) use of insulin: Secondary | ICD-10-CM | POA: Diagnosis not present

## 2023-12-09 DIAGNOSIS — E1169 Type 2 diabetes mellitus with other specified complication: Secondary | ICD-10-CM | POA: Diagnosis not present

## 2023-12-09 DIAGNOSIS — E785 Hyperlipidemia, unspecified: Secondary | ICD-10-CM | POA: Diagnosis not present

## 2023-12-09 DIAGNOSIS — E1165 Type 2 diabetes mellitus with hyperglycemia: Secondary | ICD-10-CM | POA: Diagnosis not present

## 2023-12-09 DIAGNOSIS — I152 Hypertension secondary to endocrine disorders: Secondary | ICD-10-CM | POA: Diagnosis not present

## 2023-12-09 DIAGNOSIS — N1832 Chronic kidney disease, stage 3b: Secondary | ICD-10-CM | POA: Diagnosis not present

## 2023-12-09 DIAGNOSIS — E1122 Type 2 diabetes mellitus with diabetic chronic kidney disease: Secondary | ICD-10-CM | POA: Diagnosis not present

## 2023-12-09 DIAGNOSIS — E1159 Type 2 diabetes mellitus with other circulatory complications: Secondary | ICD-10-CM | POA: Diagnosis not present

## 2023-12-28 ENCOUNTER — Telehealth: Payer: Self-pay

## 2023-12-28 NOTE — Telephone Encounter (Signed)
 Pt notified of meds ready to be picked up.  Toujeo  3 boxes lot: [id number] exp: 02/08/26

## 2023-12-29 DIAGNOSIS — I480 Paroxysmal atrial fibrillation: Secondary | ICD-10-CM | POA: Diagnosis not present

## 2023-12-29 DIAGNOSIS — E66813 Obesity, class 3: Secondary | ICD-10-CM | POA: Diagnosis not present

## 2023-12-29 DIAGNOSIS — I2510 Atherosclerotic heart disease of native coronary artery without angina pectoris: Secondary | ICD-10-CM | POA: Diagnosis not present

## 2023-12-29 DIAGNOSIS — Z8673 Personal history of transient ischemic attack (TIA), and cerebral infarction without residual deficits: Secondary | ICD-10-CM | POA: Diagnosis not present

## 2023-12-29 DIAGNOSIS — E119 Type 2 diabetes mellitus without complications: Secondary | ICD-10-CM | POA: Diagnosis not present

## 2023-12-29 DIAGNOSIS — E782 Mixed hyperlipidemia: Secondary | ICD-10-CM | POA: Diagnosis not present

## 2023-12-29 DIAGNOSIS — I10 Essential (primary) hypertension: Secondary | ICD-10-CM | POA: Diagnosis not present

## 2023-12-29 DIAGNOSIS — Z6841 Body Mass Index (BMI) 40.0 and over, adult: Secondary | ICD-10-CM | POA: Diagnosis not present

## 2024-01-12 ENCOUNTER — Ambulatory Visit: Admitting: Internal Medicine

## 2024-01-12 ENCOUNTER — Encounter: Payer: Self-pay | Admitting: Internal Medicine

## 2024-01-12 VITALS — BP 150/80 | HR 100 | Temp 98.0°F | Resp 16 | Ht 65.0 in | Wt 252.0 lb

## 2024-01-12 DIAGNOSIS — Z8673 Personal history of transient ischemic attack (TIA), and cerebral infarction without residual deficits: Secondary | ICD-10-CM

## 2024-01-12 DIAGNOSIS — I10 Essential (primary) hypertension: Secondary | ICD-10-CM | POA: Diagnosis not present

## 2024-01-12 DIAGNOSIS — I779 Disorder of arteries and arterioles, unspecified: Secondary | ICD-10-CM

## 2024-01-12 DIAGNOSIS — N1832 Chronic kidney disease, stage 3b: Secondary | ICD-10-CM

## 2024-01-12 DIAGNOSIS — I700 Atherosclerosis of aorta: Secondary | ICD-10-CM | POA: Diagnosis not present

## 2024-01-12 DIAGNOSIS — E559 Vitamin D deficiency, unspecified: Secondary | ICD-10-CM

## 2024-01-12 DIAGNOSIS — E785 Hyperlipidemia, unspecified: Secondary | ICD-10-CM

## 2024-01-12 DIAGNOSIS — I4891 Unspecified atrial fibrillation: Secondary | ICD-10-CM

## 2024-01-12 DIAGNOSIS — E1165 Type 2 diabetes mellitus with hyperglycemia: Secondary | ICD-10-CM

## 2024-01-12 DIAGNOSIS — E1129 Type 2 diabetes mellitus with other diabetic kidney complication: Secondary | ICD-10-CM

## 2024-01-12 DIAGNOSIS — E042 Nontoxic multinodular goiter: Secondary | ICD-10-CM

## 2024-01-12 NOTE — Assessment & Plan Note (Signed)
 Blood pressure elevated today.  Rechecked by me was 146/74.  She states it varies at home.  She will spot check her pressures and send in readings over the next few weeks.  Will hold on making changes in her medication today.  Follow.  Consider low-dose ARB (Benicar 5 mg) if persistent elevation.  Follow metabolic panel.

## 2024-01-12 NOTE — Progress Notes (Signed)
 Subjective:    Patient ID: Tanya Aguirre, female    DOB: 1941/02/11, 83 y.o.   MRN: [id number]  Patient here for  Chief Complaint  Patient presents with   Medical Management of Chronic Issues    HPI Here for a scheduled follow up - follow up regarding hypercholesterolemia, diabetes, afib (on flecainide now and cardizem ) and hypertension. CVA in 2021. Seeing endocrinology now. ECHO 08/25/23 - EF 55%, mild MR, mild PR, mild TR. Myocardial perfusion test - normal myocardial thickening and wall motion. No evidence of ischemia. Continue eliquis  and diltiazem . Last visit, reported side effects to amiodarone. Saw Dr Florencio 11/2023- amiodarone stopped. Started on flecainide. She is tolerating. Feels from a cardiac standpoint - stable. Breathing stable. Blood pressures varying. Highest home reading - 150 systolic. Seeing endocrinology - last check 12/09/23. Titrating insulin .  Has titrated her insulin .  States sugars are doing much better.  She is down 32 units.  States morning sugars are less than 150.  No low sugar readings.  Denies any abdominal pain or bowel change.   Past Medical History:  Diagnosis Date   A-fib (HCC)    Allergy    Arthritis    Asthma    Diabetes mellitus without complication (HCC)    Hyperlipidemia    Hypertension    Stroke [facility])    Past Surgical History:  Procedure Laterality Date   BREAST LUMPECTOMY Right    Family History  Problem Relation Age of Onset   Stroke Mother    Hypertension Mother    Diabetes Mother    Heart attack Father    Heart attack Sister    Kidney disease Sister    Hypertension Sister    Diabetes Brother    Hypertension Brother    Diabetes Mellitus II Brother    Stroke Sister    Hypertension Sister    Diabetes Sister    Hypertension Brother    Diabetes Brother    Hypertension Sister    Diabetes Sister    Arthritis Sister    Social History   Socioeconomic History   Marital status: Married    Spouse name: Not on file   Number  of children: Not on file   Years of education: Not on file   Highest education level: Not on file  Occupational History   Not on file  Tobacco Use   Smoking status: Never   Smokeless tobacco: Never  Substance and Sexual Activity   Alcohol use: Never   Drug use: Never   Sexual activity: Not on file  Other Topics Concern   Not on file  Social History Narrative   Not on file   Social Drivers of Health   Financial Resource Strain: Not on file  Food Insecurity: Not on file  Transportation Needs: Not on file  Physical Activity: Not on file  Stress: Not on file  Social Connections: Not on file     Review of Systems  Constitutional:  Negative for appetite change and unexpected weight change.  HENT:  Negative for congestion and sinus pressure.   Respiratory:  Negative for cough, chest tightness and shortness of breath.   Cardiovascular:  Negative for chest pain and palpitations.       No increased swelling.   Gastrointestinal:  Negative for abdominal pain, diarrhea, nausea and vomiting.  Genitourinary:  Negative for difficulty urinating and dysuria.  Musculoskeletal:  Negative for joint swelling and myalgias.  Skin:  Negative for color change and rash.  Neurological:  Negative for dizziness and headaches.  Psychiatric/Behavioral:  Negative for agitation and dysphoric mood.        Objective:     BP (!) 150/80   Pulse 100   Temp 98 F (36.7 C)   Resp 16   Ht 5' 5 (1.651 m)   Wt 252 lb (114.3 kg)   SpO2 98%   BMI 41.93 kg/m  Wt Readings from Last 3 Encounters:  01/12/24 252 lb (114.3 kg)  11/18/23 254 lb (115.2 kg)  09/15/23 250 lb (113.4 kg)    Physical Exam Vitals reviewed.  Constitutional:      General: She is not in acute distress.    Appearance: Normal appearance.  HENT:     Head: Normocephalic and atraumatic.     Right Ear: External ear normal.     Left Ear: External ear normal.     Mouth/Throat:     Pharynx: No oropharyngeal exudate or posterior  oropharyngeal erythema.  Eyes:     General: No scleral icterus.       Right eye: No discharge.        Left eye: No discharge.     Conjunctiva/sclera: Conjunctivae normal.  Neck:     Thyroid : No thyromegaly.  Cardiovascular:     Rate and Rhythm: Normal rate.     Comments: Irregularly irregular Pulmonary:     Effort: No respiratory distress.     Breath sounds: Normal breath sounds. No wheezing.  Abdominal:     General: Bowel sounds are normal.     Palpations: Abdomen is soft.     Tenderness: There is no abdominal tenderness.  Musculoskeletal:        General: No swelling or tenderness.     Cervical back: Neck supple. No tenderness.  Lymphadenopathy:     Cervical: No cervical adenopathy.  Skin:    Findings: No erythema or rash.  Neurological:     Mental Status: She is alert.  Psychiatric:        Mood and Affect: Mood normal.        Behavior: Behavior normal.         Outpatient Encounter Medications as of 01/12/2024  Medication Sig   ACCU-CHEK AVIVA PLUS test strip 1 each 2 (two) times daily.   Accu-Chek FastClix Lancets MISC Apply 1 each topically 2 (two) times daily.   apixaban  (ELIQUIS ) 5 MG TABS tablet Take 1 tablet (5 mg total) by mouth 2 (two) times daily.   diltiazem  (CARDIZEM  CD) 240 MG 24 hr capsule Take 240 mg by mouth daily.   DROPLET PEN NEEDLES 31G X 5 MM MISC    flecainide (TAMBOCOR) 50 MG tablet Take 50 mg by mouth 2 (two) times daily.   furosemide  (LASIX ) 40 MG tablet Take 1 tablet (40 mg total) by mouth daily.   potassium chloride  SA (KLOR-CON ) 20 MEQ tablet Take 20 mEq by mouth daily.   simvastatin  (ZOCOR ) 20 MG tablet Take 20 mg by mouth at bedtime.   TOUJEO  SOLOSTAR 300 UNIT/ML Solostar Pen Inject 30 Units into the skin daily.   Vitamin D , Ergocalciferol , (DRISDOL) 1.25 MG (50000 UNIT) CAPS capsule Take 50,000 Units by mouth once a week.   [DISCONTINUED] amiodarone (PACERONE) 100 MG tablet Take 100 mg by mouth daily.   No facility-administered encounter  medications on file as of 01/12/2024.     Lab Results  Component Value Date   WBC 5.7 11/18/2023   HGB 13.5 11/18/2023   HCT 40.9 11/18/2023   PLT 200.0  11/18/2023   GLUCOSE 172 (H) 11/18/2023   CHOL 147 09/12/2023   TRIG 101.0 09/12/2023   HDL 49.40 09/12/2023   LDLCALC 77 09/12/2023   ALT 11 11/18/2023   AST 14 11/18/2023   NA 138 11/18/2023   K 4.1 11/18/2023   CL 100 11/18/2023   CREATININE 1.39 (H) 11/18/2023   BUN 13 11/18/2023   CO2 29 11/18/2023   TSH 3.59 11/18/2023   HGBA1C 9.2 (H) 09/12/2023   MICROALBUR 9.3 (H) 09/12/2023    CT ANGIO HEAD W OR WO CONTRAST Result Date: 11/30/2019 CLINICAL DATA:  Stroke on MRI, multiple lung nodules EXAM: CT ANGIOGRAPHY HEAD TECHNIQUE: Multidetector CT imaging of the head was performed using the standard protocol during bolus administration of intravenous contrast. Multiplanar CT image reconstructions and MIPs were obtained to evaluate the vascular anatomy. CONTRAST:  OMNIPAQUE  IOHEXOL  350 MG/ML SOLN COMPARISON:  None. FINDINGS: CT HEAD Brain: There is no acute intracranial hemorrhage. More well-defined hypoattenuation is present in the right cerebellar hemisphere consistent with evolving acute infarction. The small left cerebellar infarct is better seen on MRI. There remains no significant mass effect in the posterior fossa or hydrocephalus. Chronic inferior right frontal infarct. No new loss of gray-white differentiation. Vascular: There is intracranial atherosclerotic calcification at the skull base. Skull: Unremarkable. Sinuses: Visualized portions are aerated. Orbits: Visualized portions are unremarkable. CTA HEAD Anterior circulation: Intracranial internal carotid arteries patent with calcified plaque causing mild stenosis. Anterior and middle cerebral arteries are patent. Posterior circulation: Intracranial vertebral arteries, basilar artery, and posterior cerebral arteries are patent. Right PICA origin is not well seen. AICA origins  are patent. Right superior cerebellar artery origin not well seen and there appears to be diminished flow within this vessel. There are bilateral posterior communicating arteries. There is a 2 mm inferiorly directed outpouching at the right PCOM origin. The vessel may rise eccentrically rather than at the apex of this outpouching and aneurysm cannot be excluded. Venous sinuses: Not well evaluated on this study. IMPRESSION: Evolving acute right cerebellar infarct. Small left cerebellar infarct better seen on MRI. There remains no significant mass effect or hydrocephalus. No acute intracranial hemorrhage. Right PICA and SCA origins are not well seen. 2 mm infundibulum or aneurysm at the right posterior communicating artery origin. Electronically Signed   By: Santina Blanch M.D.   On: 11/30/2019 15:11   CT CHEST W CONTRAST Result Date: 11/30/2019 CLINICAL DATA:  Nodular densities on recent CT abdomen examination EXAM: CT CHEST WITH CONTRAST TECHNIQUE: Multidetector CT imaging of the chest was performed during intravenous contrast administration. CONTRAST:  OMNIPAQUE  IOHEXOL  350 MG/ML SOLN COMPARISON:  100 mL Omnipaque  350. FINDINGS: Cardiovascular: Atherosclerotic calcifications of the thoracic aorta are noted. No aneurysmal dilatation or dissection is seen. Truncus anomaly is noted. Pulmonary artery as visualized is within normal limits. No significant coronary calcifications are noted. Heart is at the upper limits of normal in size. Mediastinum/Nodes: Thoracic inlet is within normal limits. No hilar or mediastinal adenopathy is noted. The esophagus is within normal limits. Lungs/Pleura: Lungs are well aerated bilaterally. Multiple bilateral subcentimeter nodules are noted throughout both lungs again suspicious for metastatic disease. No dominant nodule is seen. No sizable effusion is noted. Upper Abdomen: Upper abdomen demonstrates cholelithiasis without complicating factors. Normal excretion of contrast is  noted from the kidneys. No other focal abnormality is seen. Musculoskeletal: Degenerative changes of the thoracic spine are noted. No lytic or sclerotic bony lesion is seen. IMPRESSION: Multiple subcentimeter sub solid nodules are  seen highly suspicious for metastatic disease. Tissue sampling may be helpful. Cholelithiasis without complicating factors. Aortic Atherosclerosis (ICD10-I70.0). Electronically Signed   By: Oneil Devonshire M.D.   On: 11/30/2019 15:09   ECHOCARDIOGRAM COMPLETE Result Date: 11/30/2019    ECHOCARDIOGRAM REPORT   Patient Name:   Selyna ANN Lumm Date of Exam: 11/30/2019 Medical Rec #:  [id number]         Height:       65.0 in Accession #:    [id number]        Weight:       238.4 lb Date of Birth:  03-26-1941          BSA:          2.131 m Patient Age:    79 years          BP:           139/53 mmHg Patient Gender: F                 HR:           68 bpm. Exam Location:  [facility] Procedure: 2D Echo, Color Doppler and Cardiac Doppler Indications:     I163.9 Stroke  History:         Patient has no prior history of Echocardiogram examinations.                  Arrythmias:Atrial Fibrillation; Risk Factors:Hypertension and                  Diabetes.  Sonographer:     Candis Led RDCS (AE) Referring Phys:  [id number] CALEB NIU Diagnosing Phys: Evalene Lunger MD  Sonographer Comments: Suboptimal subcostal window. IMPRESSIONS  1. Left ventricular ejection fraction, by estimation, is 60 to 65%. The left ventricle has normal function. The left ventricle has no regional wall motion abnormalities. Left ventricular diastolic parameters are indeterminate.  2. Right ventricular systolic function is normal. The right ventricular size is normal.  3. Left atrial size was mildly dilated.  4. The mitral valve is normal in structure. Mild mitral valve regurgitation.  5. Rhythm is atrial fibrillation FINDINGS  Left Ventricle: Left ventricular ejection fraction, by estimation, is 60 to 65%. The left ventricle has normal function.  The left ventricle has no regional wall motion abnormalities. The left ventricular internal cavity size was normal in size. There is  no left ventricular hypertrophy. Left ventricular diastolic parameters are indeterminate. Right Ventricle: The right ventricular size is normal. No increase in right ventricular wall thickness. Right ventricular systolic function is normal. Left Atrium: Left atrial size was mildly dilated. Right Atrium: Right atrial size was normal in size. Pericardium: There is no evidence of pericardial effusion. Mitral Valve: The mitral valve is normal in structure. Normal mobility of the mitral valve leaflets. Mild mitral valve regurgitation. No evidence of mitral valve stenosis. MV peak gradient, 5.3 mmHg. The mean mitral valve gradient is 1.0 mmHg. Tricuspid Valve: The tricuspid valve is normal in structure. Tricuspid valve regurgitation is not demonstrated. No evidence of tricuspid stenosis. Aortic Valve: The aortic valve is normal in structure. Aortic valve regurgitation is not visualized. No aortic stenosis is present. Aortic valve mean gradient measures 2.0 mmHg. Aortic valve peak gradient measures 4.7 mmHg. Aortic valve area, by VTI measures 3.13 cm. Pulmonic Valve: The pulmonic valve was normal in structure. Pulmonic valve regurgitation is not visualized. No evidence of pulmonic stenosis. Aorta: The aortic root is normal in size and structure. Venous: The inferior  vena cava is normal in size with greater than 50% respiratory variability, suggesting right atrial pressure of 3 mmHg. IAS/Shunts: No atrial level shunt detected by color flow Doppler.  LEFT VENTRICLE PLAX 2D LVIDd:         4.35 cm  Diastology LVIDs:         2.39 cm  LV e' lateral:   11.50 cm/s LV PW:         0.92 cm  LV E/e' lateral: 9.6 LV IVS:        0.78 cm  LV e' medial:    8.05 cm/s LVOT diam:     2.20 cm  LV E/e' medial:  13.7 LV SV:         65 LV SV Index:   30 LVOT Area:     3.80 cm  LEFT ATRIUM             Index LA  diam:        3.80 cm 1.78 cm/m LA Vol (A2C):   51.0 ml 23.93 ml/m LA Vol (A4C):   70.6 ml 33.12 ml/m LA Biplane Vol: 62.8 ml 29.46 ml/m  AORTIC VALVE                   PULMONIC VALVE AV Area (Vmax):    2.91 cm    PV Vmax:       0.73 m/s AV Area (Vmean):   3.12 cm    PV Vmean:      50.500 cm/s AV Area (VTI):     3.13 cm    PV VTI:        0.122 m AV Vmax:           108.00 cm/s PV Peak grad:  2.1 mmHg AV Vmean:          69.100 cm/s PV Mean grad:  1.0 mmHg AV VTI:            0.208 m AV Peak Grad:      4.7 mmHg AV Mean Grad:      2.0 mmHg LVOT Vmax:         82.80 cm/s LVOT Vmean:        56.700 cm/s LVOT VTI:          0.171 m LVOT/AV VTI ratio: 0.82  AORTA Ao Root diam: 2.70 cm MITRAL VALVE MV Area (PHT): 4.59 cm     SHUNTS MV Peak grad:  5.3 mmHg     Systemic VTI:  0.17 m MV Mean grad:  1.0 mmHg     Systemic Diam: 2.20 cm MV Vmax:       1.15 m/s MV Vmean:      51.2 cm/s MV Decel Time: 165 msec MV E velocity: 110.33 cm/s MV A velocity: 49.50 cm/s MV E/A ratio:  2.23 Evalene Lunger MD Electronically signed by Evalene Lunger MD Signature Date/Time: 11/30/2019/1:28:47 PM    Final        Assessment & Plan:  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  Rechecked by me was 146/74.  She states it varies at home.  She will spot check her pressures and send in readings over the next few weeks.  Will hold on making changes in her medication today.  Follow.  Consider low-dose ARB (Benicar 5 mg) if  persistent elevation.  Follow metabolic panel.   Hyperlipidemia, unspecified hyperlipidemia type Assessment & Plan: Continues on simvastatin .  Continue low-cholesterol diet and exercise.  Wanted to hold on lab work today.  Plans to get labs on 719 through endocrinology.  Orders: -     Lipid panel; Future -     Hepatic function panel;  Future  Stage 3b chronic kidney disease (HCC) Assessment & Plan: Decreased GFR. Discussed the need to avoid antiinflammatory medication. Stay hydrated. Consider adding low dose ARB (benicar 5mg ).  Wants to hold on lab work today.  Reports she is getting labs through endocrinology on 719   Atrial fibrillation, unspecified type [facility]) Assessment & Plan: Seeing Dr. Lera.  Echocardiogram 08/25/2023 revealed ejection fraction of 55% with mild mitral regurgitation and mild pulmonary regurgitation with mild tricuspid regurgitation.  Myocardial perfusion test revealed normal myocardial thickening and wall motion.  No evidence of ischemia.  She continues on Eliquis  and diltiazem .  She is now on flecainide.  Rate controlled.  Continue follow-up with cardiology.   Aortic atherosclerosis (HCC) Assessment & Plan: Continue simvastatin .   Bilateral carotid artery disease, unspecified type (HCC) Assessment & Plan: Mild stenosis of the right proximal internal carotid artery (<50% stenosis in the range of 16-49%). Mild stenosis of the left ICA (<50% in the range of 16-49%) - 07/22/22. Continue statin medication and on eliquis .  Follow.    History of CVA (cerebrovascular accident) Assessment & Plan: History of cerebral infarction posterior circulation brain stem. Continue eliquis  and statin.    Multinodular goiter Assessment & Plan: S/p FNA 11/2022 - negative for malignancy.    Vitamin D  deficiency Assessment & Plan: Vitamin D  level March 2025 was within normal limits.      Allena Hamilton, MD

## 2024-01-12 NOTE — Assessment & Plan Note (Signed)
 Seeing Dr. Lera.  Echocardiogram 08/25/2023 revealed ejection fraction of 55% with mild mitral regurgitation and mild pulmonary regurgitation with mild tricuspid regurgitation.  Myocardial perfusion test revealed normal myocardial thickening and wall motion.  No evidence of ischemia.  She continues on Eliquis  and diltiazem .  She is now on flecainide.  Rate controlled.  Continue follow-up with cardiology.

## 2024-01-12 NOTE — Assessment & Plan Note (Signed)
 Continues on simvastatin .  Continue low-cholesterol diet and exercise.  Wanted to hold on lab work today.  Plans to get labs on 719 through endocrinology.

## 2024-01-12 NOTE — Assessment & Plan Note (Signed)
 Mild stenosis of the right proximal internal carotid artery (<50% stenosis in the range of 16-49%). Mild stenosis of the left ICA (<50% in the range of 16-49%) - 07/22/22. Continue statin medication and on eliquis. Follow.

## 2024-01-12 NOTE — Assessment & Plan Note (Signed)
 Continue simvastatin.

## 2024-01-12 NOTE — Assessment & Plan Note (Signed)
 S/p FNA 11/2022 - negative for malignancy.

## 2024-01-12 NOTE — Assessment & Plan Note (Addendum)
 Decreased GFR. Discussed the need to avoid antiinflammatory medication. Stay hydrated. Consider adding low dose ARB (benicar 5mg ).  Wants to hold on lab work today.  Reports she is getting labs through endocrinology on 719

## 2024-01-12 NOTE — Assessment & Plan Note (Signed)
 Vitamin D  level March 2025 was within normal limits.

## 2024-01-12 NOTE — Assessment & Plan Note (Signed)
 History of cerebral infarction posterior circulation brain stem. Continue eliquis and statin.

## 2024-01-27 DIAGNOSIS — Z794 Long term (current) use of insulin: Secondary | ICD-10-CM | POA: Diagnosis not present

## 2024-01-27 DIAGNOSIS — I152 Hypertension secondary to endocrine disorders: Secondary | ICD-10-CM | POA: Diagnosis not present

## 2024-01-27 DIAGNOSIS — N1832 Chronic kidney disease, stage 3b: Secondary | ICD-10-CM | POA: Diagnosis not present

## 2024-01-27 DIAGNOSIS — E1165 Type 2 diabetes mellitus with hyperglycemia: Secondary | ICD-10-CM | POA: Diagnosis not present

## 2024-01-27 DIAGNOSIS — E1159 Type 2 diabetes mellitus with other circulatory complications: Secondary | ICD-10-CM | POA: Diagnosis not present

## 2024-01-27 DIAGNOSIS — E1122 Type 2 diabetes mellitus with diabetic chronic kidney disease: Secondary | ICD-10-CM | POA: Diagnosis not present

## 2024-01-27 DIAGNOSIS — E785 Hyperlipidemia, unspecified: Secondary | ICD-10-CM | POA: Diagnosis not present

## 2024-01-27 DIAGNOSIS — E1169 Type 2 diabetes mellitus with other specified complication: Secondary | ICD-10-CM | POA: Diagnosis not present

## 2024-01-27 LAB — HEMOGLOBIN A1C: Hemoglobin A1C: 8.8

## 2024-02-23 ENCOUNTER — Other Ambulatory Visit: Payer: Self-pay | Admitting: Family Medicine

## 2024-02-23 NOTE — Telephone Encounter (Unsigned)
 Copied from CRM #[id number]. Topic: Clinical - Medication Refill >> Feb 23, 2024  2:03 PM Franky GRADE wrote: Medication: ACCU-CHEK AVIVA PLUS test strip [[id number]], Accu-Chek FastClix Lancets MISC [[id number]],furosemide  (LASIX ) 40 MG tablet [[id number]],potassium chloride  SA (KLOR-CON ) 20 MEQ tablet [[id number]],$MzfnczAzqnmzIZPI_WBmdxGosnZObGImrfEVwIEfsSdsbfUmo$$MzfnczAzqnmzIZPI_WBmdxGosnZObGImrfEVwIEfsSdsbfUmo$  (ZOCOR ) 20 MG tablet [[id number]]  Has the patient contacted their pharmacy? No (Agent: If no, request that the patient contact the pharmacy for the refill. If patient does not wish to contact the pharmacy document the reason why and proceed with request.) (Agent: If yes, when and what did the pharmacy advise?)  This is the patient's preferred pharmacy:  [facility] - [place], [place] - [address] [place] [address] Phone: [phone] Fax: [phone]   Is this the correct pharmacy for this prescription? Yes If no, delete pharmacy and type the correct one.   Has the prescription been filled recently? No  Is the patient out of the medication? No  Has the patient been seen for an appointment in the last year OR does the patient have an upcoming appointment? Yes  Can we respond through MyChart? Yes  Agent: Please be advised that Rx refills may take up to 3 business days. We ask that you follow-up with your pharmacy.

## 2024-02-23 NOTE — Telephone Encounter (Signed)
 FYI Only or Action Required?: Action required by provider: medication refill request.  Patient was last seen in primary care on 01/12/2024 by Glendia Shad, MD.  Called Nurse Triage reporting No chief complaint on file..  Symptoms began today.  Interventions attempted: Nothing.  Symptoms are: stable.  Triage Disposition: No disposition on file.  Patient/caregiver understands and will follow disposition?:

## 2024-02-24 NOTE — Telephone Encounter (Signed)
 Received refill request for lasix , potassium and simvastatin . It appears that I have never refilled this medication. Per our records - refilled 10/2019. Need to clarify if taking and who has been prescribing. If taking, need to confirm how taking and dose taking. Ok to refill diabetic needs.

## 2024-02-24 NOTE — Telephone Encounter (Signed)
 Spoke with patient to clarify what provider asked. Patient states she is currently taking those medications and she was getting them from Dr Selmer [facility]) but he has since shut down his office. Patient says she is not out of her medications yet, but she will be before her scheduled appointment with Dr Glendia. Please advise?

## 2024-02-27 MED ORDER — FUROSEMIDE 40 MG PO TABS: 40.0000 mg | ORAL_TABLET | Freq: Every day | ORAL | 1 refills | Status: DC

## 2024-02-27 MED ORDER — POTASSIUM CHLORIDE CRYS ER 20 MEQ PO TBCR: 20.0000 meq | EXTENDED_RELEASE_TABLET | Freq: Every day | ORAL | 1 refills | Status: DC

## 2024-02-27 MED ORDER — ACCU-CHEK FASTCLIX LANCETS MISC: 1.0000 | Freq: Two times a day (BID) | 1 refills | Status: AC

## 2024-02-27 MED ORDER — ACCU-CHEK AVIVA PLUS VI STRP: 1.0000 | ORAL_STRIP | Freq: Two times a day (BID) | 1 refills | Status: AC

## 2024-02-27 MED ORDER — SIMVASTATIN 20 MG PO TABS: 20.0000 mg | ORAL_TABLET | Freq: Every day | ORAL | 1 refills | Status: DC

## 2024-02-27 NOTE — Telephone Encounter (Signed)
 Lvm need to confirm pharmacy

## 2024-02-27 NOTE — Telephone Encounter (Signed)
 The prescription request I received is for mail order. Does she want these prescriptions to go to mail order. If so, need to be for 90 days?

## 2024-02-27 NOTE — Telephone Encounter (Signed)
 Copied from CRM #[id number]. Topic: General - Call Back - No Documentation >> Feb 27, 2024 10:46 AM Chiquita SQUIBB wrote: Reason for CRM: Patient is calling Dr. Pete nurse back to let them know to send it to [facility] - [place], [place] - [phone], for 90 days.

## 2024-03-19 ENCOUNTER — Ambulatory Visit: Admitting: Internal Medicine

## 2024-03-19 VITALS — BP 140/72 | HR 86 | Resp 16 | Ht 65.0 in | Wt 253.8 lb

## 2024-03-19 DIAGNOSIS — E785 Hyperlipidemia, unspecified: Secondary | ICD-10-CM | POA: Diagnosis not present

## 2024-03-19 DIAGNOSIS — I700 Atherosclerosis of aorta: Secondary | ICD-10-CM | POA: Diagnosis not present

## 2024-03-19 DIAGNOSIS — I10 Essential (primary) hypertension: Secondary | ICD-10-CM

## 2024-03-19 DIAGNOSIS — N1832 Chronic kidney disease, stage 3b: Secondary | ICD-10-CM | POA: Diagnosis not present

## 2024-03-19 DIAGNOSIS — I779 Disorder of arteries and arterioles, unspecified: Secondary | ICD-10-CM

## 2024-03-19 DIAGNOSIS — I4891 Unspecified atrial fibrillation: Secondary | ICD-10-CM

## 2024-03-19 DIAGNOSIS — Z794 Long term (current) use of insulin: Secondary | ICD-10-CM

## 2024-03-19 DIAGNOSIS — E1129 Type 2 diabetes mellitus with other diabetic kidney complication: Secondary | ICD-10-CM

## 2024-03-19 DIAGNOSIS — E042 Nontoxic multinodular goiter: Secondary | ICD-10-CM

## 2024-03-19 DIAGNOSIS — E1165 Type 2 diabetes mellitus with hyperglycemia: Secondary | ICD-10-CM | POA: Diagnosis not present

## 2024-03-19 DIAGNOSIS — Z23 Encounter for immunization: Secondary | ICD-10-CM

## 2024-03-19 DIAGNOSIS — Z8673 Personal history of transient ischemic attack (TIA), and cerebral infarction without residual deficits: Secondary | ICD-10-CM | POA: Diagnosis not present

## 2024-03-19 LAB — BASIC METABOLIC PANEL WITH GFR
BUN: 17 mg/dL (ref 6–23)
CO2: 27 meq/L (ref 19–32)
Calcium: 9.4 mg/dL (ref 8.4–10.5)
Chloride: 100 meq/L (ref 96–112)
Creatinine, Ser: 1.37 mg/dL — ABNORMAL HIGH (ref 0.40–1.20)
GFR: 35.98 mL/min — ABNORMAL LOW (ref >60.00)
Glucose, Bld: 206 mg/dL — ABNORMAL HIGH (ref 70–99)
Potassium: 4.3 meq/L (ref 3.5–5.1)
Sodium: 135 meq/L (ref 135–145)

## 2024-03-19 LAB — LIPID PANEL
Cholesterol: 185 mg/dL (ref 0–200)
HDL: 57 mg/dL (ref >39.00)
LDL Cholesterol: 103 mg/dL — ABNORMAL HIGH (ref 0–99)
NonHDL: 128.33
Total CHOL/HDL Ratio: 3
Triglycerides: 129 mg/dL (ref 0.0–149.0)
VLDL: 25.8 mg/dL (ref 0.0–40.0)

## 2024-03-19 LAB — HEPATIC FUNCTION PANEL
ALT: 11 U/L (ref 0–35)
AST: 12 U/L (ref 0–37)
Albumin: 4 g/dL (ref 3.5–5.2)
Alkaline Phosphatase: 90 U/L (ref 39–117)
Bilirubin, Direct: 0.1 mg/dL (ref 0.0–0.3)
Total Bilirubin: 0.5 mg/dL (ref 0.2–1.2)
Total Protein: 7.1 g/dL (ref 6.0–8.3)

## 2024-03-19 NOTE — Progress Notes (Unsigned)
 Subjective:    Patient ID: Tanya Aguirre, female    DOB: 1941/07/07, 83 y.o.   MRN: [id number]  Patient here for  Chief Complaint  Patient presents with   Medical Management of Chronic Issues    HPI Here for a scheduled follow up - follow up regarding hypercholesterolemia, diabetes, afib (on flecainide now and cardizem ) and hypertension. CVA in 2021. ECHO 08/25/23 - EF 55%, mild MR, mild PR, mild TR. Myocardial perfusion test - normal myocardial thickening and wall motion. No evidence of ischemia. Continue eliquis  and diltiazem . Seeing endocrinology. Last evaluated 01/27/24. Recommended to increase toujeo  to 34 units at bedtime. Last A1c 01/27/24 - 8.8.    Past Medical History:  Diagnosis Date   A-fib (HCC)    Allergy    Arthritis    Asthma    Diabetes mellitus without complication (HCC)    Hyperlipidemia    Hypertension    Stroke [facility])    Past Surgical History:  Procedure Laterality Date   BREAST LUMPECTOMY Right    Family History  Problem Relation Age of Onset   Stroke Mother    Hypertension Mother    Diabetes Mother    Heart attack Father    Heart attack Sister    Kidney disease Sister    Hypertension Sister    Diabetes Brother    Hypertension Brother    Diabetes Mellitus II Brother    Stroke Sister    Hypertension Sister    Diabetes Sister    Hypertension Brother    Diabetes Brother    Hypertension Sister    Diabetes Sister    Arthritis Sister    Social History   Socioeconomic History   Marital status: Married    Spouse name: Not on file   Number of children: Not on file   Years of education: Not on file   Highest education level: Not on file  Occupational History   Not on file  Tobacco Use   Smoking status: Never   Smokeless tobacco: Never  Substance and Sexual Activity   Alcohol use: Never   Drug use: Never   Sexual activity: Not on file  Other Topics Concern   Not on file  Social History Narrative   Not on file   Social Drivers of  Health   Financial Resource Strain: Not on file  Food Insecurity: Not on file  Transportation Needs: Not on file  Physical Activity: Not on file  Stress: Not on file  Social Connections: Not on file     Review of Systems     Objective:     BP (!) 140/72   Pulse 86   Resp 16   Ht 5' 5 (1.651 m)   Wt 253 lb 12.8 oz (115.1 kg)   SpO2 98%   BMI 42.23 kg/m  Wt Readings from Last 3 Encounters:  03/19/24 253 lb 12.8 oz (115.1 kg)  01/12/24 252 lb (114.3 kg)  11/18/23 254 lb (115.2 kg)    Physical Exam  {Perform Simple Foot Exam  Perform Detailed exam:1} {Insert foot Exam (Optional):30965}   Outpatient Encounter Medications as of 03/19/2024  Medication Sig   ACCU-CHEK AVIVA PLUS test strip 1 each by Other route 2 (two) times daily.   Accu-Chek FastClix Lancets MISC Apply 1 each topically 2 (two) times daily.   apixaban  (ELIQUIS ) 5 MG TABS tablet Take 1 tablet (5 mg total) by mouth 2 (two) times daily.   diltiazem  (CARDIZEM  CD) 240 MG 24 hr capsule  Take 240 mg by mouth daily.   DROPLET PEN NEEDLES 31G X 5 MM MISC    flecainide (TAMBOCOR) 50 MG tablet Take 50 mg by mouth 2 (two) times daily.   furosemide  (LASIX ) 40 MG tablet Take 1 tablet (40 mg total) by mouth daily.   potassium chloride  SA (KLOR-CON  M) 20 MEQ tablet Take 1 tablet (20 mEq total) by mouth daily.   simvastatin  (ZOCOR ) 20 MG tablet Take 1 tablet (20 mg total) by mouth at bedtime.   TOUJEO  SOLOSTAR 300 UNIT/ML Solostar Pen Inject 30 Units into the skin daily.   Vitamin D , Ergocalciferol , (DRISDOL) 1.25 MG (50000 UNIT) CAPS capsule Take 50,000 Units by mouth once a week.   No facility-administered encounter medications on file as of 03/19/2024.     Lab Results  Component Value Date   WBC 5.7 11/18/2023   HGB 13.5 11/18/2023   HCT 40.9 11/18/2023   PLT 200.0 11/18/2023   GLUCOSE 172 (H) 11/18/2023   CHOL 147 09/12/2023   TRIG 101.0 09/12/2023   HDL 49.40 09/12/2023   LDLCALC 77 09/12/2023   ALT 11  11/18/2023   AST 14 11/18/2023   NA 138 11/18/2023   K 4.1 11/18/2023   CL 100 11/18/2023   CREATININE 1.39 (H) 11/18/2023   BUN 13 11/18/2023   CO2 29 11/18/2023   TSH 3.59 11/18/2023   HGBA1C 8.8 01/27/2024   MICROALBUR 9.3 (H) 09/12/2023    CT ANGIO HEAD W OR WO CONTRAST Result Date: 11/30/2019 CLINICAL DATA:  Stroke on MRI, multiple lung nodules EXAM: CT ANGIOGRAPHY HEAD TECHNIQUE: Multidetector CT imaging of the head was performed using the standard protocol during bolus administration of intravenous contrast. Multiplanar CT image reconstructions and MIPs were obtained to evaluate the vascular anatomy. CONTRAST:  OMNIPAQUE  IOHEXOL  350 MG/ML SOLN COMPARISON:  None. FINDINGS: CT HEAD Brain: There is no acute intracranial hemorrhage. More well-defined hypoattenuation is present in the right cerebellar hemisphere consistent with evolving acute infarction. The small left cerebellar infarct is better seen on MRI. There remains no significant mass effect in the posterior fossa or hydrocephalus. Chronic inferior right frontal infarct. No new loss of gray-white differentiation. Vascular: There is intracranial atherosclerotic calcification at the skull base. Skull: Unremarkable. Sinuses: Visualized portions are aerated. Orbits: Visualized portions are unremarkable. CTA HEAD Anterior circulation: Intracranial internal carotid arteries patent with calcified plaque causing mild stenosis. Anterior and middle cerebral arteries are patent. Posterior circulation: Intracranial vertebral arteries, basilar artery, and posterior cerebral arteries are patent. Right PICA origin is not well seen. AICA origins are patent. Right superior cerebellar artery origin not well seen and there appears to be diminished flow within this vessel. There are bilateral posterior communicating arteries. There is a 2 mm inferiorly directed outpouching at the right PCOM origin. The vessel may rise eccentrically rather than at the apex  of this outpouching and aneurysm cannot be excluded. Venous sinuses: Not well evaluated on this study. IMPRESSION: Evolving acute right cerebellar infarct. Small left cerebellar infarct better seen on MRI. There remains no significant mass effect or hydrocephalus. No acute intracranial hemorrhage. Right PICA and SCA origins are not well seen. 2 mm infundibulum or aneurysm at the right posterior communicating artery origin. Electronically Signed   By: Santina Blanch M.D.   On: 11/30/2019 15:11   CT CHEST W CONTRAST Result Date: 11/30/2019 CLINICAL DATA:  Nodular densities on recent CT abdomen examination EXAM: CT CHEST WITH CONTRAST TECHNIQUE: Multidetector CT imaging of the chest was performed during intravenous  contrast administration. CONTRAST:  OMNIPAQUE  IOHEXOL  350 MG/ML SOLN COMPARISON:  100 mL Omnipaque  350. FINDINGS: Cardiovascular: Atherosclerotic calcifications of the thoracic aorta are noted. No aneurysmal dilatation or dissection is seen. Truncus anomaly is noted. Pulmonary artery as visualized is within normal limits. No significant coronary calcifications are noted. Heart is at the upper limits of normal in size. Mediastinum/Nodes: Thoracic inlet is within normal limits. No hilar or mediastinal adenopathy is noted. The esophagus is within normal limits. Lungs/Pleura: Lungs are well aerated bilaterally. Multiple bilateral subcentimeter nodules are noted throughout both lungs again suspicious for metastatic disease. No dominant nodule is seen. No sizable effusion is noted. Upper Abdomen: Upper abdomen demonstrates cholelithiasis without complicating factors. Normal excretion of contrast is noted from the kidneys. No other focal abnormality is seen. Musculoskeletal: Degenerative changes of the thoracic spine are noted. No lytic or sclerotic bony lesion is seen. IMPRESSION: Multiple subcentimeter sub solid nodules are seen highly suspicious for metastatic disease. Tissue sampling may be helpful.  Cholelithiasis without complicating factors. Aortic Atherosclerosis (ICD10-I70.0). Electronically Signed   By: Oneil Devonshire M.D.   On: 11/30/2019 15:09   ECHOCARDIOGRAM COMPLETE Result Date: 11/30/2019    ECHOCARDIOGRAM REPORT   Patient Name:   Tanya Aguirre Date of Exam: 11/30/2019 Medical Rec #:  [id number]         Height:       65.0 in Accession #:    [id number]        Weight:       238.4 lb Date of Birth:  Jul 25, 1940          BSA:          2.131 m Patient Age:    79 years          BP:           139/53 mmHg Patient Gender: F                 HR:           68 bpm. Exam Location:  [facility] Procedure: 2D Echo, Color Doppler and Cardiac Doppler Indications:     I163.9 Stroke  History:         Patient has no prior history of Echocardiogram examinations.                  Arrythmias:Atrial Fibrillation; Risk Factors:Hypertension and                  Diabetes.  Sonographer:     Candis Led RDCS (AE) Referring Phys:  [id number] CALEB NIU Diagnosing Phys: Evalene Lunger MD  Sonographer Comments: Suboptimal subcostal window. IMPRESSIONS  1. Left ventricular ejection fraction, by estimation, is 60 to 65%. The left ventricle has normal function. The left ventricle has no regional wall motion abnormalities. Left ventricular diastolic parameters are indeterminate.  2. Right ventricular systolic function is normal. The right ventricular size is normal.  3. Left atrial size was mildly dilated.  4. The mitral valve is normal in structure. Mild mitral valve regurgitation.  5. Rhythm is atrial fibrillation FINDINGS  Left Ventricle: Left ventricular ejection fraction, by estimation, is 60 to 65%. The left ventricle has normal function. The left ventricle has no regional wall motion abnormalities. The left ventricular internal cavity size was normal in size. There is  no left ventricular hypertrophy. Left ventricular diastolic parameters are indeterminate. Right Ventricle: The right ventricular size is normal. No increase in right ventricular  wall thickness. Right ventricular systolic function is  normal. Left Atrium: Left atrial size was mildly dilated. Right Atrium: Right atrial size was normal in size. Pericardium: There is no evidence of pericardial effusion. Mitral Valve: The mitral valve is normal in structure. Normal mobility of the mitral valve leaflets. Mild mitral valve regurgitation. No evidence of mitral valve stenosis. MV peak gradient, 5.3 mmHg. The mean mitral valve gradient is 1.0 mmHg. Tricuspid Valve: The tricuspid valve is normal in structure. Tricuspid valve regurgitation is not demonstrated. No evidence of tricuspid stenosis. Aortic Valve: The aortic valve is normal in structure. Aortic valve regurgitation is not visualized. No aortic stenosis is present. Aortic valve mean gradient measures 2.0 mmHg. Aortic valve peak gradient measures 4.7 mmHg. Aortic valve area, by VTI measures 3.13 cm. Pulmonic Valve: The pulmonic valve was normal in structure. Pulmonic valve regurgitation is not visualized. No evidence of pulmonic stenosis. Aorta: The aortic root is normal in size and structure. Venous: The inferior vena cava is normal in size with greater than 50% respiratory variability, suggesting right atrial pressure of 3 mmHg. IAS/Shunts: No atrial level shunt detected by color flow Doppler.  LEFT VENTRICLE PLAX 2D LVIDd:         4.35 cm  Diastology LVIDs:         2.39 cm  LV e' lateral:   11.50 cm/s LV PW:         0.92 cm  LV E/e' lateral: 9.6 LV IVS:        0.78 cm  LV e' medial:    8.05 cm/s LVOT diam:     2.20 cm  LV E/e' medial:  13.7 LV SV:         65 LV SV Index:   30 LVOT Area:     3.80 cm  LEFT ATRIUM             Index LA diam:        3.80 cm 1.78 cm/m LA Vol (A2C):   51.0 ml 23.93 ml/m LA Vol (A4C):   70.6 ml 33.12 ml/m LA Biplane Vol: 62.8 ml 29.46 ml/m  AORTIC VALVE                   PULMONIC VALVE AV Area (Vmax):    2.91 cm    PV Vmax:       0.73 m/s AV Area (Vmean):   3.12 cm    PV Vmean:      50.500 cm/s AV Area (VTI):      3.13 cm    PV VTI:        0.122 m AV Vmax:           108.00 cm/s PV Peak grad:  2.1 mmHg AV Vmean:          69.100 cm/s PV Mean grad:  1.0 mmHg AV VTI:            0.208 m AV Peak Grad:      4.7 mmHg AV Mean Grad:      2.0 mmHg LVOT Vmax:         82.80 cm/s LVOT Vmean:        56.700 cm/s LVOT VTI:          0.171 m LVOT/AV VTI ratio: 0.82  AORTA Ao Root diam: 2.70 cm MITRAL VALVE MV Area (PHT): 4.59 cm     SHUNTS MV Peak grad:  5.3 mmHg     Systemic VTI:  0.17 m MV Mean grad:  1.0 mmHg  Systemic Diam: 2.20 cm MV Vmax:       1.15 m/s MV Vmean:      51.2 cm/s MV Decel Time: 165 msec MV E velocity: 110.33 cm/s MV A velocity: 49.50 cm/s MV E/A ratio:  2.23 Evalene Lunger MD Electronically signed by Evalene Lunger MD Signature Date/Time: 11/30/2019/1:28:47 PM    Final        Assessment & Plan:  Immunization due -     Flu vaccine HIGH DOSE PF(Fluzone Trivalent)     Allena Hamilton, MD

## 2024-03-19 NOTE — Patient Instructions (Signed)
 Kaopectate - can be used for diarrhea.   Can also take probiotics - florastor, benefiber or culturelle.

## 2024-03-20 ENCOUNTER — Ambulatory Visit: Payer: Self-pay | Admitting: Internal Medicine

## 2024-03-20 DIAGNOSIS — E1129 Type 2 diabetes mellitus with other diabetic kidney complication: Secondary | ICD-10-CM

## 2024-03-20 NOTE — Telephone Encounter (Signed)
 Correction scheduled pt for 04/04/2024 and lab ordered placed

## 2024-03-21 ENCOUNTER — Other Ambulatory Visit: Payer: Self-pay | Admitting: Internal Medicine

## 2024-03-21 MED ORDER — OLMESARTAN MEDOXOMIL 5 MG PO TABS: 5.0000 mg | ORAL_TABLET | Freq: Every day | ORAL | 1 refills | Status: DC

## 2024-03-21 MED ORDER — ROSUVASTATIN CALCIUM 20 MG PO TABS: 20.0000 mg | ORAL_TABLET | Freq: Every day | ORAL | 2 refills | Status: DC

## 2024-03-21 NOTE — Telephone Encounter (Unsigned)
 Copied from CRM [id number]. Topic: General - Other >> Mar 21, 2024  8:55 AM Burnard DEL wrote: Reason for CRM: Patient returned call to CMA.Message was relayed to patient. Patient verbalized understanding of change of medications.

## 2024-03-21 NOTE — Progress Notes (Signed)
 Rx sent in to local pharmacy - since new medications.

## 2024-03-25 ENCOUNTER — Encounter: Payer: Self-pay | Admitting: Internal Medicine

## 2024-03-25 NOTE — Assessment & Plan Note (Signed)
 Seeing endocrinology. Last evaluated 01/27/24. Recommended to increase toujeo  to 34 units at bedtime. Last A1c 01/27/24 - 8.8.

## 2024-03-25 NOTE — Assessment & Plan Note (Signed)
 Seeing Dr. Florencio.  Echocardiogram 08/25/2023 revealed ejection fraction of 55% with mild mitral regurgitation and mild pulmonary regurgitation with mild tricuspid regurgitation.  Myocardial perfusion test revealed normal myocardial thickening and wall motion.  No evidence of ischemia.  She continues on Eliquis  and diltiazem .  She is now on flecainide.  Rate controlled.  Continue follow-up with cardiology.

## 2024-03-25 NOTE — Assessment & Plan Note (Signed)
 Mild stenosis of the right proximal internal carotid artery (<50% stenosis in the range of 16-49%). Mild stenosis of the left ICA (<50% in the range of 16-49%) - 07/22/22. Continue statin medication and on eliquis. Follow.

## 2024-03-25 NOTE — Assessment & Plan Note (Signed)
 History of cerebral infarction posterior circulation brain stem. Continue eliquis and statin.

## 2024-03-25 NOTE — Assessment & Plan Note (Signed)
 Continues on simvastatin . Will change to crestor  20mg  q day for better LDL control. Follow lipid panel.  Lab Results  Component Value Date   CHOL 185 03/19/2024   HDL 57.00 03/19/2024   LDLCALC 103 (H) 03/19/2024   TRIG 129.0 03/19/2024   CHOLHDL 3 03/19/2024

## 2024-03-25 NOTE — Assessment & Plan Note (Addendum)
 S/p FNA 11/2022 - negative for malignancy. Follow tsh. TSH wnl 11/2023.

## 2024-03-25 NOTE — Assessment & Plan Note (Signed)
 Blood pressure remaining elevated. Will start benicar  5mg  q day as outlined. Check metabolic panel in 10-14 days. Follow pressures.

## 2024-03-25 NOTE — Assessment & Plan Note (Signed)
 Start crestor  as outined.

## 2024-03-25 NOTE — Assessment & Plan Note (Signed)
 Decreased GFR. Discussed the need to avoid antiinflammatory medication. Stay hydrated. Consider adding low dose ARB (benicar  5mg ).  After labs reviewed and given persistent elevation of blood pressure, will start benicar  5mg  q day. Check metabolic panel in 10-14 days.

## 2024-04-04 ENCOUNTER — Other Ambulatory Visit (INDEPENDENT_AMBULATORY_CARE_PROVIDER_SITE_OTHER)

## 2024-04-04 DIAGNOSIS — E1165 Type 2 diabetes mellitus with hyperglycemia: Secondary | ICD-10-CM | POA: Diagnosis not present

## 2024-04-04 DIAGNOSIS — E1129 Type 2 diabetes mellitus with other diabetic kidney complication: Secondary | ICD-10-CM

## 2024-04-04 LAB — BASIC METABOLIC PANEL WITH GFR
BUN: 17 mg/dL (ref 6–23)
CO2: 29 meq/L (ref 19–32)
Calcium: 9.2 mg/dL (ref 8.4–10.5)
Chloride: 100 meq/L (ref 96–112)
Creatinine, Ser: 1.39 mg/dL — ABNORMAL HIGH (ref 0.40–1.20)
GFR: 35.35 mL/min — ABNORMAL LOW (ref >60.00)
Glucose, Bld: 211 mg/dL — ABNORMAL HIGH (ref 70–99)
Potassium: 4.6 meq/L (ref 3.5–5.1)
Sodium: 136 meq/L (ref 135–145)

## 2024-04-05 ENCOUNTER — Telehealth: Payer: Self-pay

## 2024-04-05 ENCOUNTER — Ambulatory Visit: Payer: Self-pay | Admitting: Internal Medicine

## 2024-04-05 NOTE — Telephone Encounter (Signed)
 Received 3 boxes of Toujeo  300 units/ml of patient assistance medication.  Called pt and she is aware that she needs to pick up medication.

## 2024-05-12 ENCOUNTER — Other Ambulatory Visit: Payer: Self-pay | Admitting: Internal Medicine

## 2024-05-22 ENCOUNTER — Telehealth: Payer: Self-pay

## 2024-05-22 NOTE — Telephone Encounter (Signed)
 Patient was identified as falling into the [place] Measure - Diabetes.   Patient was: Appointment already scheduled for:  Endo 05/24/24 and Dr. Glendia 06/22/24.

## 2024-05-28 ENCOUNTER — Telehealth: Payer: Self-pay

## 2024-05-28 NOTE — Telephone Encounter (Signed)
 Received paperwork from [employer]. Placed on Lindsay's desk for proper review.

## 2024-05-29 ENCOUNTER — Telehealth: Payer: Self-pay

## 2024-05-29 NOTE — Telephone Encounter (Signed)
Noted.  Let me know if I need to do anything.  ?

## 2024-05-29 NOTE — Telephone Encounter (Signed)
 PAP: Patient assistance application for Toujeo  through [employer] has been mailed to pt's home address on file. Provider portion of application will be faxed to provider's office.

## 2024-06-12 ENCOUNTER — Other Ambulatory Visit: Payer: Self-pay | Admitting: Internal Medicine

## 2024-06-15 ENCOUNTER — Telehealth: Payer: Self-pay

## 2024-06-15 ENCOUNTER — Encounter: Payer: Self-pay | Admitting: Pharmacist

## 2024-06-15 NOTE — Telephone Encounter (Signed)
 Medication has been changed to Soliqua. Will re-send provider portion to support change.

## 2024-06-15 NOTE — Telephone Encounter (Signed)
 PAP: Patient assistance application for Soliqua through [employer] has been mailed to pt's home address on file. Provider portion of application will be faxed to provider's office.

## 2024-06-15 NOTE — Progress Notes (Signed)
 Chart Review Reason: Patient assistance   Summary: In the process of renewal with [facility] PAP team for [employer]   12/2: Tanya Aguirre Previously tried medications for diabetes: Metformin -- d/c due to decline in renal function. Jardiance -- unable to start due to high cost (she did not qualify for patient assistance)   Plan: Soliqua in place of Toujeo  per Aguirre visit 12/2. PAP team aware.   Considerations: Fax sent to [place]:  Attn: Calton Buel Cohn [phone] (Fax)   FYI sent to Aguirre for future consideration. The income patient wrote in her application for Jardiance last year was too high, but is under the cutoff for the Farxiga PAP as their program is more forgiving. Consider Farxiga as possible future option in the setting of CKD/uncontrolled sugar.  Feel free to reach out if at any point you decide on Farxiga, and need assistance with the application.   Tanya Aguirre, PharmD Clinical Pharmacist [facility] [phone]

## 2024-06-19 NOTE — Telephone Encounter (Signed)
 Received Provider Portion PAP application Soliqua ([employer])

## 2024-06-20 ENCOUNTER — Encounter: Payer: Self-pay | Admitting: Pharmacist

## 2024-06-20 NOTE — Progress Notes (Addendum)
 Patient Assistance Program (PAP) Application   Manufacturer: [employer] ([employer])    (New enrollment) Medication(s): Farxiga 10 mg (Via Endocrinologist)  Patient Portion of Application:  12/10: Filled out patient application 12/10 for patient signature at office visit this week 06/22/24.  Provider Portion of Application:  12/10: Filled out and sent to Endo office Attn: Calton Cohn, PA-C Prescription(s): Included in MAP application.  Next Steps: [x]    Application filled out and uploaded to PCP's eFax folder for review/signature [x]    Application adjusted to reflect PCP in place of cardiology team per request  []    Once patient and provider sign, CMA may fax to [facility] team and scan to chart ([facility] PAP Fax = [phone]) []    []     Forwarded to [facility] CPh[facility] Patient Advocate Team for future correspondences/re-enrollment.  Note routed to PCP Clinic Pool to ensure PCP signature is obtained and application is faxed.  *[facility] clinic team - Please Addend/update this note as the Next Steps are completed in office*

## 2024-06-20 NOTE — Progress Notes (Signed)
 Pharmacy Quality Measure Review  This patient is appearing on a report for being at risk of failing the Controlling Blood Pressure measure this calendar year.   Last documented BP BP Readings from Last 1 Encounters:  03/19/24 (!) 140/72   does not meet criteria for measure closure (BP <140/90).  Closed/passed: KED, SUPD/SPC  2025 f/u scheduled: YES - 06/22/24 Encounter note placed **[facility] pt - Ensure BP resting >25min. Recheck if higher than 139/89**   Future Appointments  Date Time Provider Department Center  06/22/2024  9:30 AM Glendia Shad, MD [facility] [id number]

## 2024-06-20 NOTE — Progress Notes (Signed)
 Received placed in folder

## 2024-06-21 NOTE — Progress Notes (Signed)
 Noted, received. Printed and placed in pcp folder

## 2024-06-21 NOTE — Progress Notes (Signed)
 Do not see form. Need form to sign.

## 2024-06-22 ENCOUNTER — Encounter: Admitting: Internal Medicine

## 2024-06-22 NOTE — Progress Notes (Signed)
 Signed by provider.  Mychart message sent to patient asking her to come to the office to sign and complete her portion. Once signed needs to be faxed to [facility] PAP Fax: [phone]

## 2024-07-11 ENCOUNTER — Other Ambulatory Visit: Payer: Self-pay | Admitting: Internal Medicine

## 2024-07-23 ENCOUNTER — Other Ambulatory Visit: Payer: Self-pay | Admitting: Internal Medicine

## 2024-07-23 NOTE — Telephone Encounter (Signed)
 Pt has been changed to rosuvastatin .

## 2024-09-04 ENCOUNTER — Encounter: Admitting: Internal Medicine
